# Patient Record
Sex: Female | Born: 1965
Health system: Southern US, Community
[De-identification: ages and names within clinical notes are randomized; demographics above are authoritative.]

## PROBLEM LIST (undated history)

## (undated) DIAGNOSIS — M1990 Unspecified osteoarthritis, unspecified site: Secondary | ICD-10-CM

## (undated) DIAGNOSIS — E119 Type 2 diabetes mellitus without complications: Secondary | ICD-10-CM

## (undated) DIAGNOSIS — R519 Headache, unspecified: Secondary | ICD-10-CM

## (undated) DIAGNOSIS — K219 Gastro-esophageal reflux disease without esophagitis: Secondary | ICD-10-CM

## (undated) DIAGNOSIS — D649 Anemia, unspecified: Secondary | ICD-10-CM

## (undated) DIAGNOSIS — T7840XA Allergy, unspecified, initial encounter: Secondary | ICD-10-CM

## (undated) DIAGNOSIS — K5080 Crohn's disease of both small and large intestine without complications: Secondary | ICD-10-CM

## (undated) DIAGNOSIS — G2581 Restless legs syndrome: Secondary | ICD-10-CM

## (undated) DIAGNOSIS — M461 Sacroiliitis, not elsewhere classified: Secondary | ICD-10-CM

## (undated) DIAGNOSIS — L439 Lichen planus, unspecified: Secondary | ICD-10-CM

## (undated) HISTORY — DX: Type 2 diabetes mellitus without complications: E11.9

## (undated) HISTORY — DX: Sacroiliitis, not elsewhere classified: M46.1

## (undated) HISTORY — DX: Crohn's disease of both small and large intestine without complications: K50.80

## (undated) HISTORY — DX: Lichen planus, unspecified: L43.9

## (undated) HISTORY — DX: Restless legs syndrome: G25.81

## (undated) HISTORY — DX: Allergy, unspecified, initial encounter: T78.40XA

## (undated) HISTORY — DX: Gastro-esophageal reflux disease without esophagitis: K21.9

## (undated) HISTORY — PX: CHOLECYSTECTOMY: SHX55

---

## 2004-04-16 DIAGNOSIS — K5090 Crohn's disease, unspecified, without complications: Secondary | ICD-10-CM

## 2004-04-16 HISTORY — DX: Crohn's disease, unspecified, without complications: K50.90

## 2017-04-11 DIAGNOSIS — K5090 Crohn's disease, unspecified, without complications: Secondary | ICD-10-CM | POA: Insufficient documentation

## 2018-03-28 DIAGNOSIS — Z8719 Personal history of other diseases of the digestive system: Secondary | ICD-10-CM | POA: Insufficient documentation

## 2018-03-28 DIAGNOSIS — E669 Obesity, unspecified: Secondary | ICD-10-CM | POA: Insufficient documentation

## 2018-04-01 DIAGNOSIS — G2581 Restless legs syndrome: Secondary | ICD-10-CM | POA: Insufficient documentation

## 2018-04-16 HISTORY — PX: COLONOSCOPY: SHX174

## 2018-08-01 DIAGNOSIS — Z79899 Other long term (current) drug therapy: Secondary | ICD-10-CM | POA: Insufficient documentation

## 2018-10-07 DIAGNOSIS — M461 Sacroiliitis, not elsewhere classified: Secondary | ICD-10-CM | POA: Insufficient documentation

## 2019-05-06 ENCOUNTER — Encounter (HOSPITAL_COMMUNITY): Payer: Self-pay | Admitting: Emergency Medicine

## 2019-05-06 ENCOUNTER — Emergency Department (HOSPITAL_COMMUNITY): Payer: BC Managed Care – PPO

## 2019-05-06 ENCOUNTER — Emergency Department (HOSPITAL_COMMUNITY)
Admission: EM | Admit: 2019-05-06 | Discharge: 2019-05-06 | Disposition: A | Discharge status: Home / Self Care | Payer: BC Managed Care – PPO | Attending: Emergency Medicine | Admitting: Emergency Medicine

## 2019-05-06 ENCOUNTER — Other Ambulatory Visit: Payer: Self-pay

## 2019-05-06 DIAGNOSIS — R1010 Upper abdominal pain, unspecified: Secondary | ICD-10-CM

## 2019-05-06 DIAGNOSIS — M79661 Pain in right lower leg: Secondary | ICD-10-CM | POA: Diagnosis not present

## 2019-05-06 DIAGNOSIS — K922 Gastrointestinal hemorrhage, unspecified: Secondary | ICD-10-CM

## 2019-05-06 LAB — URINALYSIS, ROUTINE W REFLEX MICROSCOPIC
Bilirubin Urine: NEGATIVE
Glucose, UA: NEGATIVE mg/dL
Hgb urine dipstick: NEGATIVE
Ketones, ur: NEGATIVE mg/dL
Nitrite: NEGATIVE
Protein, ur: 30 mg/dL — AB
Specific Gravity, Urine: 1.019 (ref 1.005–1.030)
pH: 5 (ref 5.0–8.0)

## 2019-05-06 LAB — COMPREHENSIVE METABOLIC PANEL
ALT: 25 U/L (ref 0–44)
AST: 28 U/L (ref 15–41)
Albumin: 4.1 g/dL (ref 3.5–5.0)
Alkaline Phosphatase: 85 U/L (ref 38–126)
Anion gap: 10 (ref 5–15)
BUN: 12 mg/dL (ref 6–20)
CO2: 23 mmol/L (ref 22–32)
Calcium: 9.6 mg/dL (ref 8.9–10.3)
Chloride: 104 mmol/L (ref 98–111)
Creatinine, Ser: 0.82 mg/dL (ref 0.44–1.00)
GFR calc Af Amer: >60 mL/min (ref >60)
GFR calc non Af Amer: >60 mL/min (ref >60)
Glucose, Bld: 92 mg/dL (ref 70–99)
Potassium: 4.1 mmol/L (ref 3.5–5.1)
Sodium: 137 mmol/L (ref 135–145)
Total Bilirubin: 0.5 mg/dL (ref 0.3–1.2)
Total Protein: 8.6 g/dL — ABNORMAL HIGH (ref 6.5–8.1)

## 2019-05-06 LAB — CBC
HCT: 43.4 % (ref 36.0–46.0)
Hemoglobin: 13.7 g/dL (ref 12.0–15.0)
MCH: 28.1 pg (ref 26.0–34.0)
MCHC: 31.6 g/dL (ref 30.0–36.0)
MCV: 89.1 fL (ref 80.0–100.0)
Platelets: 365 10*3/uL (ref 150–400)
RBC: 4.87 MIL/uL (ref 3.87–5.11)
RDW: 14.3 % (ref 11.5–15.5)
WBC: 7.9 10*3/uL (ref 4.0–10.5)
nRBC: 0 % (ref 0.0–0.2)

## 2019-05-06 LAB — I-STAT BETA HCG BLOOD, ED (MC, WL, AP ONLY): I-stat hCG, quantitative: <5 m[IU]/mL (ref <5)

## 2019-05-06 LAB — LIPASE, BLOOD: Lipase: 25 U/L (ref 11–51)

## 2019-05-06 LAB — TROPONIN I (HIGH SENSITIVITY): Troponin I (High Sensitivity): 3 ng/L (ref <18)

## 2019-05-06 MED ORDER — OMEPRAZOLE 20 MG PO CPDR: 20.0000 mg | DELAYED_RELEASE_CAPSULE | Freq: Every day | ORAL | 0 refills | Status: DC

## 2019-05-06 MED ORDER — ONDANSETRON HCL 4 MG/2ML IJ SOLN
4.0000 mg | Freq: Once | INTRAMUSCULAR | Status: AC
  Administered 2019-05-06: 4 mg via INTRAVENOUS
  Filled 2019-05-06: qty 2

## 2019-05-06 MED ORDER — ONDANSETRON 4 MG PO TBDP: 4.0000 mg | ORAL_TABLET | Freq: Three times a day (TID) | ORAL | 0 refills | Status: DC | PRN

## 2019-05-06 MED ORDER — SODIUM CHLORIDE 0.9 % IV BOLUS
500.0000 mL | Freq: Once | INTRAVENOUS | Status: AC
  Administered 2019-05-06: 16:00:00 500 mL via INTRAVENOUS

## 2019-05-06 NOTE — Discharge Instructions (Signed)
Your lab work is reassuring.  I think he likely did vomit up some blood but it appears it was stopped and your lab work is reassuring.  Follow-up with your gastroenterologist.  Take the nausea medicine as needed and take the acid medicine.  Return if you feel more bleeding or lightheadedness or dizziness

## 2019-05-06 NOTE — ED Triage Notes (Addendum)
Pt c/o abd pain whiel at work and was vomiting and had diarrhea (chron's). Then started having chest pain, headache, right calf pain. She reports that her vomit contained blood (bright red) and yellow stuff. All symptoms started less than an hour ago.

## 2019-05-06 NOTE — ED Provider Notes (Signed)
[facility] DEPT Provider Note   CSN: [id number] Arrival date & time: 05/06/19  1431     History Chief Complaint  Patient presents with  . Abdominal Pain  . Headache  . Chest Pain  . Leg Pain    right calf  . Emesis    Sarah Meadows is a 54 y.o. female.  HPI Patient presents with abdominal pain chest pain myalgias vomiting and some diarrhea.  Also some pain in the right calf.  States it began today.  History of Crohn's disease.  Get seen by digestive health for it.  Previously been on Remicade but states it does not work.  States she vomited up some blood today.  Has diarrhea but no blood in the diarrhea today.  States couple days ago the diarrhea was darker.  Chest pain is dull.  No fevers.  No lightheadedness or dizziness.  Pain was in the upper abdomen.    History reviewed. No pertinent past medical history.  There are no problems to display for this patient.   Past Surgical History:  Procedure Laterality Date  . CESAREAN SECTION    . CHOLECYSTECTOMY       OB History   No obstetric history on file.     No family history on file.  Social History   Tobacco Use  . Smoking status: Never Smoker  . Smokeless tobacco: Never Used  Substance Use Topics  . Alcohol use: Not Currently  . Drug use: Not on file    Home Medications Prior to Admission medications   Medication Sig Start Date End Date Taking? Authorizing Provider  omeprazole (PRILOSEC) 20 MG capsule Take 1 capsule (20 mg total) by mouth daily. 05/06/19   Davonna Belling, MD  ondansetron (ZOFRAN-ODT) 4 MG disintegrating tablet Take 1 tablet (4 mg total) by mouth every 8 (eight) hours as needed for nausea or vomiting. 05/06/19   Davonna Belling, MD    Allergies    Patient has no known allergies.  Review of Systems   Review of Systems  Constitutional: Positive for appetite change.  HENT: Negative for congestion.   Respiratory: Negative for shortness of breath.    Cardiovascular: Positive for chest pain.  Gastrointestinal: Positive for abdominal pain, diarrhea, nausea and vomiting.  Musculoskeletal: Negative for back pain.  Skin: Negative for rash.  Neurological: Positive for headaches.  Psychiatric/Behavioral: Negative for confusion.    Physical Exam Updated Vital Signs BP (!) 147/77 (BP Location: Right Arm)   Pulse 90   Temp 98.8 F (37.1 C) (Oral)   Resp 18   SpO2 99%   Physical Exam Vitals and nursing note reviewed.  HENT:     Head: Normocephalic.  Eyes:     Pupils: Pupils are equal, round, and reactive to light.  Cardiovascular:     Rate and Rhythm: Regular rhythm.  Pulmonary:     Breath sounds: No wheezing, rhonchi or rales.  Abdominal:     Palpations: Abdomen is soft.     Tenderness: There is no abdominal tenderness.     Hernia: No hernia is present.  Skin:    General: Skin is warm.     Comments: No extremity tenderness.     ED Results / Procedures / Treatments   Labs (all labs ordered are listed, but only abnormal results are displayed) Labs Reviewed  COMPREHENSIVE METABOLIC PANEL - Abnormal; Notable for the following components:      Result Value   Total Protein 8.6 (*)    All  other components within normal limits  URINALYSIS, ROUTINE W REFLEX MICROSCOPIC - Abnormal; Notable for the following components:   Protein, ur 30 (*)    Leukocytes,Ua SMALL (*)    Bacteria, UA RARE (*)    All other components within normal limits  CBC  LIPASE, BLOOD  I-STAT BETA HCG BLOOD, ED ([facility], [facility], [facility] ONLY)  TROPONIN I (HIGH SENSITIVITY)    EKG EKG Interpretation  Date/Time:  Wednesday May 06 2019 14:42:13 EST Ventricular Rate:  115 PR Interval:  144 QRS Duration: 80 QT Interval:  312 QTC Calculation: 431 R Axis:   -17 Text Interpretation: Sinus tachycardia Otherwise normal ECG No old tracing to compare Confirmed by Aletta Edouard [phone]) on 05/06/2019 2:45:13 PM   Radiology DG Chest 2 View  Result Date: 05/06/2019  CLINICAL DATA:  Chest pain. Abdominal pain with vomiting and diarrhea at work. EXAM: CHEST - 2 VIEW COMPARISON:  None. FINDINGS: The heart size and mediastinal contours are within normal limits. Both lungs are clear. The visualized skeletal structures are unremarkable. IMPRESSION: No active cardiopulmonary disease. Electronically Signed   By: Marin Olp M.D.   On: 05/06/2019 14:58    Procedures Procedures (including critical care time)  Medications Ordered in ED Medications  sodium chloride 0.9 % bolus 500 mL (0 mLs Intravenous Stopped 05/06/19 1639)  ondansetron (ZOFRAN) injection 4 mg (4 mg Intravenous Given 05/06/19 1554)    ED Course  I have reviewed the triage vital signs and the nursing notes.  Pertinent labs & imaging results that were available during my care of the patient were reviewed by me and considered in my medical decision making (see chart for details).    MDM Rules/Calculators/A&P                      Patient presents abdominal pain and vomiting blood.  Benign exam.  Feels better after treatment.  Lab work reassuring.  Heart rate is come down.  No further vomiting.  History of Crohn's disease.  Will start patient on Prilosec and nausea medicine.  She has a gastroenterologist she can follow-up with.  She appears stable for discharge at this time. Final Clinical Impression(s) / ED Diagnoses Final diagnoses:  Pain of upper abdomen  Gastrointestinal hemorrhage, unspecified gastrointestinal hemorrhage type    Rx / DC Orders ED Discharge Orders         Ordered    omeprazole (PRILOSEC) 20 MG capsule  Daily     05/06/19 1804    ondansetron (ZOFRAN-ODT) 4 MG disintegrating tablet  Every 8 hours PRN     05/06/19 1804           Davonna Belling, MD 05/06/19 1806

## 2019-09-02 ENCOUNTER — Telehealth: Payer: Self-pay | Admitting: Gastroenterology

## 2019-09-02 NOTE — Telephone Encounter (Signed)
DOD 09/02/19  Dr. Havery Moros, this patient stated that she has recently moved to [facility] and would like to establish care with a local GI MD.  She has been seeing Dr. Reesa Chew at [facility] for Crohn's disease.  Records are available in [place].  Please review and advise scheduling.

## 2019-09-02 NOTE — Telephone Encounter (Signed)
I can see her in the office for routine consult. thanks

## 2019-09-03 ENCOUNTER — Encounter: Payer: Self-pay | Admitting: Gastroenterology

## 2019-10-21 ENCOUNTER — Telehealth: Payer: Self-pay

## 2019-10-21 NOTE — Telephone Encounter (Signed)
-----   Message from Yetta Flock, MD sent at 10/16/2019  2:04 PM EDT ----- Regarding: RE: new pt that see [place] Jan I'll see her if she wishes to be seen her, I guess you can ask her what her preference is and where she wants to be seen, thanks ----- Message ----- From: Roetta Sessions, CMA Sent: 10/15/2019   5:27 PM EDT To: Yetta Flock, MD Subject: new pt that see [facility]               Dr. Loni Muse - this pt has an appt on Thursday, 7-8 for Crohn's.  However, she has been seeing [facility] for years and as recently as 08-2019.  Would you like to see her? Office policy is that if she has had GI history in the last year she is supposed to provide records for your review to see if you will accept her as a patient first.  I don't know if she wants a 2nd opinion or if she wants to transfer care.  Thanks, Jan

## 2019-10-21 NOTE — Telephone Encounter (Signed)
Called and spoke to pt.  She would like to transfer her care to Dr. Havery Moros.  Faxed a release to pt at [phone]  She will fax to Dr. Bary Leriche office at [facility] asking them to fax her records (colons/EGDs/Pathologies) to [place] for tomorrow's appt. Some records are in [facility]

## 2019-10-22 ENCOUNTER — Other Ambulatory Visit (INDEPENDENT_AMBULATORY_CARE_PROVIDER_SITE_OTHER): Payer: BC Managed Care – PPO

## 2019-10-22 ENCOUNTER — Ambulatory Visit (INDEPENDENT_AMBULATORY_CARE_PROVIDER_SITE_OTHER): Payer: BC Managed Care – PPO | Admitting: Gastroenterology

## 2019-10-22 ENCOUNTER — Encounter: Payer: Self-pay | Admitting: Gastroenterology

## 2019-10-22 VITALS — BP 112/85 | HR 89 | Ht 61.0 in | Wt 204.4 lb

## 2019-10-22 DIAGNOSIS — K50819 Crohn's disease of both small and large intestine with unspecified complications: Secondary | ICD-10-CM | POA: Diagnosis not present

## 2019-10-22 DIAGNOSIS — M461 Sacroiliitis, not elsewhere classified: Secondary | ICD-10-CM

## 2019-10-22 DIAGNOSIS — L439 Lichen planus, unspecified: Secondary | ICD-10-CM

## 2019-10-22 LAB — COMPREHENSIVE METABOLIC PANEL
ALT: 22 U/L (ref 0–35)
AST: 23 U/L (ref 0–37)
Albumin: 4.1 g/dL (ref 3.5–5.2)
Alkaline Phosphatase: 82 U/L (ref 39–117)
BUN: 9 mg/dL (ref 6–23)
CO2: 28 mEq/L (ref 19–32)
Calcium: 9.3 mg/dL (ref 8.4–10.5)
Chloride: 102 mEq/L (ref 96–112)
Creatinine, Ser: 0.71 mg/dL (ref 0.40–1.20)
GFR: 104.17 mL/min (ref >60.00)
Glucose, Bld: 85 mg/dL (ref 70–99)
Potassium: 3.9 mEq/L (ref 3.5–5.1)
Sodium: 137 mEq/L (ref 135–145)
Total Bilirubin: 0.4 mg/dL (ref 0.2–1.2)
Total Protein: 7.9 g/dL (ref 6.0–8.3)

## 2019-10-22 LAB — VITAMIN B12: Vitamin B-12: 364 pg/mL (ref 211–911)

## 2019-10-22 LAB — CBC WITH DIFFERENTIAL/PLATELET
Basophils Absolute: 0 10*3/uL (ref 0.0–0.1)
Basophils Relative: 0.2 % (ref 0.0–3.0)
Eosinophils Absolute: 0.2 10*3/uL (ref 0.0–0.7)
Eosinophils Relative: 3.2 % (ref 0.0–5.0)
HCT: 37.8 % (ref 36.0–46.0)
Hemoglobin: 12.6 g/dL (ref 12.0–15.0)
Lymphocytes Relative: 42.9 % (ref 12.0–46.0)
Lymphs Abs: 2.7 10*3/uL (ref 0.7–4.0)
MCHC: 33.3 g/dL (ref 30.0–36.0)
MCV: 87.2 fl (ref 78.0–100.0)
Monocytes Absolute: 0.5 10*3/uL (ref 0.1–1.0)
Monocytes Relative: 7.2 % (ref 3.0–12.0)
Neutro Abs: 3 10*3/uL (ref 1.4–7.7)
Neutrophils Relative %: 46.5 % (ref 43.0–77.0)
Platelets: 289 10*3/uL (ref 150.0–400.0)
RBC: 4.34 Mil/uL (ref 3.87–5.11)
RDW: 14.3 % (ref 11.5–15.5)
WBC: 6.4 10*3/uL (ref 4.0–10.5)

## 2019-10-22 LAB — C-REACTIVE PROTEIN: CRP: 1.3 mg/dL (ref 0.5–20.0)

## 2019-10-22 LAB — FERRITIN: Ferritin: 125.1 ng/mL (ref 10.0–291.0)

## 2019-10-22 LAB — PROTIME-INR
INR: 1.1 ratio — ABNORMAL HIGH (ref 0.8–1.0)
Prothrombin Time: 12 s (ref 9.6–13.1)

## 2019-10-22 LAB — IBC + FERRITIN
Ferritin: 125.1 ng/mL (ref 10.0–291.0)
Iron: 57 ug/dL (ref 42–145)
Saturation Ratios: 16.6 % — ABNORMAL LOW (ref 20.0–50.0)
Transferrin: 246 mg/dL (ref 212.0–360.0)

## 2019-10-22 LAB — VITAMIN D 25 HYDROXY (VIT D DEFICIENCY, FRACTURES): VITD: 33.27 ng/mL (ref 30.00–100.00)

## 2019-10-22 MED ORDER — BUDESONIDE 3 MG PO CPEP: ORAL_CAPSULE | ORAL | 0 refills | Status: AC

## 2019-10-22 MED ORDER — ONDANSETRON 4 MG PO TBDP: 4.0000 mg | ORAL_TABLET | Freq: Four times a day (QID) | ORAL | 3 refills | Status: DC | PRN

## 2019-10-22 NOTE — Progress Notes (Signed)
HPI : 54 year old female with a reported history of Crohn's disease, sacroiliitis, lichen planus, self-referred here for further evaluation and long-term management of her Crohn's disease.  History obtained through the patient and per review of the chart.  She has been seen most recently by Dr. Esaw Dace for this however she wishes to transfer her care locally to be followed in [place].  From what I can gather she was diagnosed with ileocolonic Crohn's disease in 2006.  She states symptoms were apparently mild at that time and she never had any therapy for this until 2020 or so.  She may have received a few courses of steroids over the years but does not sound like she has had that much at all.  Symptoms apparently worsened in 2020, she had a colonoscopy at that time which revealed multiple deep ulcers in the cecum and a deformed IC valve.  Ileum could not be intubated.  Biopsies showed chronic active colitis.  CT enterography showed some wall thickening in the ileum and questionable duodenum.  At that point time she was placed on Remicade.  During this time she is also developed bilateral sacroiliitis.  Per review of the chart there was some issue with compliance with Remicade infusions.  She apparently felt worse earlier this year in 2021, Remicade levels were drawn and were undetectable with an extremely high antibody level as below and it was stopped.  She was most recently transitioned to Humira and after a few doses developed a rash on her arms for which she was referred to dermatology.  She had a biopsy which showed lichen planus and it was unclear if Humira had been related to that or not.  She has been using steroid ointment for this and it has improved but has not resolved.  She has been off Humira now for several weeks.  She has not been taking anything for her Crohn's disease since then.  Main symptoms from her Crohn's disease are abdominal pain as well as diarrhea.  She has anywhere from  5-6 bowel movements a day, sometimes more.  She does not have any blood in her stools.  She does have some mid to lower abdominal pain which comes and goes, often sporadic, occasionally after she eats.  She does have some nausea postprandially and rare vomiting.  Her weight has been stable, no weight loss.  She denies any NSAIDs.  She has had symptomatic sacroiliitis in the past although it has not really been bothering her that much recently.  She has never had a prior operation for her Crohn's, she does not use any tobacco.  Both her sister and her father have Crohn's disease.  Her last colonoscopy was in 2020.  There was discussion of transitioning to another biologic such as Entyvio or Stelara however she has not pursued that yet and wanted to establish with [place] first.  I do not see any labs for tuberculosis testing, hepatitis, etc. recently.  Labs: 05/14/19 - infliximab level undetectable, antibody level of 1632   EGD 05/25/19 - small HH, erythema of the antrum, normal duodenum - Dr. Esaw Dace - path negative for H pylori, shows mild chronic inactive gastritis  Colonoscopy 05/06/18 - multiple deep ulcers in the cecum, IC valve deformed, could not intubate the TI, diverticulosis noted, otherwise normal colon  - path shows moderate active colitis in IC valve and cecum, otherise normal biopsies throughout  CT enterography - 05/14/18 - wall thickening of the ileum and duodenum, bilateral sacroileitis  CT scan Feb 2021 - mild fatty liver, no active Crohn's otherwise    Past Medical History:  Diagnosis Date  . Crohn's disease of both small and large intestine ([place])   . Lichen planus   . Sacroiliac inflammation [facility])      Past Surgical History:  Procedure Laterality Date  . CESAREAN SECTION    . CHOLECYSTECTOMY     Family History  Problem Relation Age of Onset  . Colon cancer Neg Hx   . Pancreatic cancer Neg Hx   . Stomach cancer Neg Hx    Social History   Tobacco Use  . Smoking  status: Never Smoker  . Smokeless tobacco: Never Used  Vaping Use  . Vaping Use: Never used  Substance Use Topics  . Alcohol use: Not Currently  . Drug use: Not on file   Current Outpatient Medications  Medication Sig Dispense Refill  . clobetasol cream (TEMOVATE) 5.28 % Apply 1 application topically 2 (two) times daily.    Marland Kitchen triamcinolone cream (KENALOG) 0.1 % Apply 1 application topically 2 (two) times daily.    . budesonide (ENTOCORT EC) 3 MG 24 hr capsule Take 3 capsules (9 mg total) by mouth daily for 28 days, THEN 2 capsules (6 mg total) daily for 14 days, THEN 1 capsule (3 mg total) daily for 14 days. 126 capsule 0  . ondansetron (ZOFRAN-ODT) 4 MG disintegrating tablet Take 1 tablet (4 mg total) by mouth every 6 (six) hours as needed for nausea or vomiting. 30 tablet 3   No current facility-administered medications for this visit.   Allergies  Allergen Reactions  . Adalimumab Rash     Review of Systems: All systems reviewed and negative except where noted in HPI.   Most recent labs from January show normal hemoglobin.  LFTs and renal function also normal.  Physical Exam: BP 112/85   Pulse 89   Ht 5' 1"  (1.549 m)   Wt 204 lb 6 oz (92.7 kg)   BMI 38.62 kg/m  Constitutional: Pleasant,well-developed, female in no acute distress. HEENT: Normocephalic and atraumatic. Conjunctivae are normal. No scleral icterus. Neck supple.  Cardiovascular: Normal rate, regular rhythm.  Pulmonary/chest: Effort normal and breath sounds normal. No wheezing, rales or rhonchi. Abdominal: Soft, nondistended, nontender.  There are no masses palpable.  Extremities: no edema Lymphadenopathy: No cervical adenopathy noted. Neurological: Alert and oriented to person place and time. Skin: Skin is warm and dry. No rashes noted. Psychiatric: Normal mood and affect. Behavior is normal.   ASSESSMENT AND PLAN: 54 year old female here for new patient assessment the following:  Ileocolonic Crohn's  disease / sacroiliitis - as above, she carries a diagnosis of Crohn's disease but had been mostly observed for what sounds like 14 years until she had therapy with Remicade when symptoms worsened.  It appears she did have interrupted therapy with Remicade over the course of a year and developed significant antibody titers to it rendering the drug useless with undetectable levels.  As above she was transitioned to Humira which was complicated by development of lichen planus and Humira had been stopped by dermatology.  The patient has ongoing diarrhea and abdominal pain since stopping Humira.  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  I discussed options with the patient.  I am going to send some basic labs to assess for anemia, inflammatory markers, will check fecal calprotectin, we  will check her iron studies and B12 levels, as well as vitamin D.  I discussed her options for Crohn's disease, namely Stelara and Entyvio in regards to biologic therapy, I would recommend 1 of these options.  Given her impressive immunogenicity to Remicade and her course over the past year with development of sacroiliitis, I think she would benefit from combination therapy with the thiopurine as well.  I discussed risks and benefits of biologic therapy and thiopurines as well as combination therapy with her, she was in agreement to proceed with this if she is a candidate.  I will check her TPMT enzyme assay to see if she is cleared for this and check tuberculosis as well as hepatitis testing to clear her for biologic therapy.  Given location of her disease I would prefer to use Stelara if she can get this approved, will check with her insurance company.  In the interim given her ongoing symptoms we will give her a trial of budesonide 9 mg a day for 4 weeks and taper to 6 mg for 2 weeks, then 3 mg for 2 weeks.   Hopefully we can start her on therapy for her Crohn's in the interim.  She will need a colonoscopy for reassessment of her disease at some point time, would prefer to get her set up on biologic therapy and perform this exam after a few months on therapy to assess for mucosal healing, etc.  She was agreeable with this.  I would like to see her back in the next 6 weeks or so, will await her labs with further recommendations.  We will otherwise give some Zofran for her nausea as needed.  She agreed.  If any worsening symptoms or fevers, she needs to contact me immediately.  Of note, recommend  pneumococcal and COVID-19 vaccine prior to starting biologic therapy if she has not already had these.  Lichen planus - she will continue to follow-up with dermatology for this issue  [place] Cellar, MD [facility]

## 2019-10-22 NOTE — Patient Instructions (Addendum)
If you are age 53 or older, your body mass index should be between 23-30. Your Body mass index is 38.62 kg/m. If this is out of the aforementioned range listed, please consider follow up with your Primary Care Provider.  If you are age 83 or younger, your body mass index should be between 19-25. Your Body mass index is 38.62 kg/m. If this is out of the aformentioned range listed, please consider follow up with your Primary Care Provider.   Please go to the lab in the basement of our building to have lab work done as you leave today. Hit "B" for basement when you get on the elevator.  When the doors open the lab is on your left.  We will call you with the results. Thank you.  We have sent the following medications to your pharmacy for you to pick up at your convenience: Budesonide 56m: Take 9 mg a day for 4 weeks, then 6 mg daily for 2 weeks, then 3 mg for 2 weeks  Zofran ODT 4 mg: Take every 6 hours as needed   You have a follow up appointment on Thursday, August 26th at 3:40pm.   Thank you for entrusting me with your care and for [facility]

## 2019-10-23 ENCOUNTER — Other Ambulatory Visit: Payer: BC Managed Care – PPO

## 2019-10-23 DIAGNOSIS — L439 Lichen planus, unspecified: Secondary | ICD-10-CM

## 2019-10-23 DIAGNOSIS — K50819 Crohn's disease of both small and large intestine with unspecified complications: Secondary | ICD-10-CM

## 2019-10-23 DIAGNOSIS — M461 Sacroiliitis, not elsewhere classified: Secondary | ICD-10-CM

## 2019-10-27 LAB — CALPROTECTIN, FECAL: Calprotectin, Fecal: 97 ug/g (ref 0–120)

## 2019-11-01 LAB — QUANTIFERON-TB GOLD PLUS
Mitogen-NIL: >10 IU/mL
NIL: 0.07 IU/mL
QuantiFERON-TB Gold Plus: NEGATIVE
TB1-NIL: 0.02 IU/mL
TB2-NIL: 0.01 IU/mL

## 2019-11-01 LAB — HEPATITIS B CORE ANTIBODY, TOTAL: Hep B Core Total Ab: NONREACTIVE

## 2019-11-01 LAB — HEPATITIS C ANTIBODY
Hepatitis C Ab: NONREACTIVE
SIGNAL TO CUT-OFF: 0.33 (ref <1.00)

## 2019-11-01 LAB — THIOPURINE METHYLTRANSFERASE (TPMT), RBC: Thiopurine Methyltransferase, RBC: 10 nmol/hr/mL RBC — ABNORMAL LOW

## 2019-11-01 LAB — HEPATITIS B SURFACE ANTIGEN: Hepatitis B Surface Ag: NONREACTIVE

## 2019-11-05 ENCOUNTER — Encounter: Payer: Self-pay | Admitting: Gastroenterology

## 2019-11-19 ENCOUNTER — Ambulatory Visit (AMBULATORY_SURGERY_CENTER): Payer: Self-pay

## 2019-11-19 ENCOUNTER — Other Ambulatory Visit: Payer: Self-pay

## 2019-11-19 ENCOUNTER — Encounter: Payer: Self-pay | Admitting: Gastroenterology

## 2019-11-19 VITALS — Ht 61.0 in | Wt 205.6 lb

## 2019-11-19 DIAGNOSIS — K50819 Crohn's disease of both small and large intestine with unspecified complications: Secondary | ICD-10-CM

## 2019-11-19 MED ORDER — NA SULFATE-K SULFATE-MG SULF 17.5-3.13-1.6 GM/177ML PO SOLN: 1.0000 | Freq: Once | ORAL | 0 refills | Status: AC

## 2019-11-19 NOTE — Progress Notes (Signed)
No allergies to soy or egg Pt is not on blood thinners or diet pills-stopped phentermine over a yr ago. Denies issues with sedation/intubation Denies atrial flutter/fib Denies constipation   Emmi instructions given to pt  Pt is aware of Covid safety and care partner requirements.

## 2019-12-02 ENCOUNTER — Encounter: Payer: Self-pay | Admitting: Certified Registered Nurse Anesthetist

## 2019-12-03 ENCOUNTER — Other Ambulatory Visit: Payer: Self-pay

## 2019-12-03 ENCOUNTER — Encounter: Payer: Self-pay | Admitting: Gastroenterology

## 2019-12-03 ENCOUNTER — Ambulatory Visit (AMBULATORY_SURGERY_CENTER): Payer: BC Managed Care – PPO | Admitting: Gastroenterology

## 2019-12-03 VITALS — BP 114/79 | HR 77 | Temp 97.8°F | Resp 17 | Ht 61.0 in | Wt 205.0 lb

## 2019-12-03 DIAGNOSIS — K635 Polyp of colon: Secondary | ICD-10-CM

## 2019-12-03 DIAGNOSIS — K5730 Diverticulosis of large intestine without perforation or abscess without bleeding: Secondary | ICD-10-CM | POA: Diagnosis not present

## 2019-12-03 DIAGNOSIS — K56699 Other intestinal obstruction unspecified as to partial versus complete obstruction: Secondary | ICD-10-CM | POA: Diagnosis not present

## 2019-12-03 DIAGNOSIS — K50819 Crohn's disease of both small and large intestine with unspecified complications: Secondary | ICD-10-CM | POA: Diagnosis not present

## 2019-12-03 DIAGNOSIS — K529 Noninfective gastroenteritis and colitis, unspecified: Secondary | ICD-10-CM | POA: Diagnosis not present

## 2019-12-03 DIAGNOSIS — D125 Benign neoplasm of sigmoid colon: Secondary | ICD-10-CM

## 2019-12-03 DIAGNOSIS — K633 Ulcer of intestine: Secondary | ICD-10-CM

## 2019-12-03 MED ORDER — SODIUM CHLORIDE 0.9 % IV SOLN
500.0000 mL | Freq: Once | INTRAVENOUS | Status: DC
Start: 2019-12-03 — End: 2019-12-03

## 2019-12-03 NOTE — Progress Notes (Signed)
Report given to PACU, vss 

## 2019-12-03 NOTE — Op Note (Signed)
[facility] Patient Name: Sarah Meadows Procedure Date: 12/03/2019 10:21 AM MRN: [id number] Endoscopist: Remo Lipps P. Havery Moros , MD Age: 54 Referring MD:  Date of Birth: Mar 09, 1966 Gender: Female Account #: [id number] Procedure:                Colonoscopy Indications:              For therapy of Crohn's disease of the small bowel                            and colon - failed Remicade (immunogenicity),                            allergic to Humira (skin reaction), now on                            budesonide taper with improvement of symptoms, labs                            without anemia, fecal calprotectin borderline (97),                            here for re-staging of disease prior to                            consideration of another biologic therapy. TPMT 10                            (low) Medicines:                Monitored Anesthesia Care Procedure:                Pre-Anesthesia Assessment:                           - Prior to the procedure, a History and Physical                            was performed, and patient medications and                            allergies were reviewed. The patient's tolerance of                            previous anesthesia was also reviewed. The risks                            and benefits of the procedure and the sedation                            options and risks were discussed with the patient.                            All questions were answered, and informed consent  was obtained. Prior Anticoagulants: The patient has                            taken no previous anticoagulant or antiplatelet                            agents. ASA Grade Assessment: II - A patient with                            mild systemic disease. After reviewing the risks                            and benefits, the patient was deemed in                            satisfactory condition to undergo the procedure.                            After obtaining informed consent, the colonoscope                            was passed under direct vision. Throughout the                            procedure, the patient's blood pressure, pulse, and                            oxygen saturations were monitored continuously. The                            Colonoscope was introduced through the anus and                            advanced to the the terminal ileum, with                            identification of the appendiceal orifice and IC                            valve. The colonoscopy was performed without                            difficulty. The patient tolerated the procedure                            well. The quality of the bowel preparation was                            good. The terminal ileum, ileocecal valve,                            appendiceal orifice, and rectum were photographed. Scope In: 10:29:06 AM Scope Out: 10:51:08 AM Scope Withdrawal Time: 0 hours 18 minutes 56 seconds  Total Procedure  Duration: 0 hours 22 minutes 2 seconds  Findings:                 The perianal and digital rectal examinations were                            normal.                           A mild stenosis was found at the IC valve but                            easily traversed. Patchy inflammation characterized                            by erosions and erythema was found in the terminal                            ileum. There appeared to be another area of                            stricturing a few cm proximal to the valve which                            was not traversed.                           A benign-appearing, intrinsic moderate to severe                            stenosis was found in the cecum near the IC valve.                            This was able to be traversed to visualize the                            cecal cap, which was otherwise normal. Mild                            ulceration noted at the  stricture. Biopsies were                            taken with a cold forceps for histology.                           A 4 mm polyp was found in the sigmoid colon. The                            polyp was sessile. The polyp was removed with a                            cold snare. Resection and retrieval were complete.  Multiple small-mouthed diverticula were found in                            the left colon.                           The exam was otherwise without abnormality. No                            other obvious inflammation noted.                           Biopsies for histology were taken with a cold                            forceps from the right colon, left colon and                            transverse colon for evaluation of colitis. Complications:            No immediate complications. Estimated blood loss:                            Minimal. Estimated Blood Loss:     Estimated blood loss was minimal. Impression:               - Crohn's disease with ileitis - mild stricture at                            IC valve, active ileitis, with stricture of the                            cecum as outlined. Biopsied.                           - One 4 mm polyp in the sigmoid colon, removed with                            a cold snare. Resected and retrieved.                           - Diverticulosis in the left colon.                           - The examination was otherwise normal.                           - Biopsies were taken with a cold forceps from the                            right colon, left colon and transverse colon for                            evaluation of colitis.  Patient warrants escalation of care to biologic                            therapy, recommend Stelara vs. Entyvio, will                            discuss with patient. May also need surgical                            evaluation given ileal and cecal  stricturing. Recommendation:           - Patient has a contact number available for                            emergencies. The signs and symptoms of potential                            delayed complications were discussed with the                            patient. Return to normal activities tomorrow.                            Written discharge instructions were provided to the                            patient.                           - Resume previous diet.                           - Continue present medications.                           - Await pathology results with plans to start                            biologic therapy as outlined. Remo Lipps P. Leodis Alcocer, MD 12/03/2019 10:59:53 AM This report has been signed electronically.

## 2019-12-03 NOTE — Patient Instructions (Signed)
Impression/Recommendations:  Polyp handout given to patient. Diverticulosis handout given to patient.  Resume previous diet. Continue present medications. Await pathology results.  YOU HAD AN ENDOSCOPIC PROCEDURE TODAY AT [facility]:   Refer to the procedure report that was given to you for any specific questions about what was found during the examination.  If the procedure report does not answer your questions, please call your gastroenterologist to clarify.  If you requested that your care partner not be given the details of your procedure findings, then the procedure report has been included in a sealed envelope for you to review at your convenience later.  YOU SHOULD EXPECT: Some feelings of bloating in the abdomen. Passage of more gas than usual.  Walking can help get rid of the air that was put into your GI tract during the procedure and reduce the bloating. If you had a lower endoscopy (such as a colonoscopy or flexible sigmoidoscopy) you may notice spotting of blood in your stool or on the toilet paper. If you underwent a bowel prep for your procedure, you may not have a normal bowel movement for a few days.  Please Note:  You might notice some irritation and congestion in your nose or some drainage.  This is from the oxygen used during your procedure.  There is no need for concern and it should clear up in a day or so.  SYMPTOMS TO REPORT IMMEDIATELY:   Following lower endoscopy (colonoscopy or flexible sigmoidoscopy):  Excessive amounts of blood in the stool  Significant tenderness or worsening of abdominal pains  Swelling of the abdomen that is new, acute  Fever of 100F or higher  For urgent or emergent issues, a gastroenterologist can be reached at any hour by calling [phone]. Do not use MyChart messaging for urgent concerns.    DIET:  We do recommend a small meal at first, but then you may proceed to your regular diet.  Drink plenty of fluids but  you should avoid alcoholic beverages for 24 hours.  ACTIVITY:  You should plan to take it easy for the rest of today and you should NOT DRIVE or use heavy machinery until tomorrow (because of the sedation medicines used during the test).    FOLLOW UP: Our staff will call the number listed on your records 48-72 hours following your procedure to check on you and address any questions or concerns that you may have regarding the information given to you following your procedure. If we do not reach you, we will leave a message.  We will attempt to reach you two times.  During this call, we will ask if you have developed any symptoms of COVID 19. If you develop any symptoms (ie: fever, flu-like symptoms, shortness of breath, cough etc.) before then, please call [phone].  If you test positive for Covid 19 in the 2 weeks post procedure, please call and report this information to [place].    If any biopsies were taken you will be contacted by phone or by letter within the next 1-3 weeks.  Please call us at [phone] if you have not heard about the biopsies in 3 weeks.    SIGNATURES/CONFIDENTIALITY: You and/or your care partner have signed paperwork which will be entered into your electronic medical record.  These signatures attest to the fact that that the information above on your After Visit Summary has been reviewed and is understood.  Full responsibility of the confidentiality of this discharge information lies with  you and/or your care-partner.

## 2019-12-03 NOTE — Progress Notes (Signed)
Called to room to assist during endoscopic procedure.  Patient ID and intended procedure confirmed with present staff. Received instructions for my participation in the procedure from the performing physician.  

## 2019-12-07 ENCOUNTER — Telehealth: Payer: Self-pay

## 2019-12-07 NOTE — Telephone Encounter (Signed)
Covid-19 screening questions   Do you now or have you had a fever in the last 14 days? No.  Do you have any respiratory symptoms of shortness of breath or cough now or in the last 14 days? No.  Do you have any family members or close contacts with diagnosed or suspected Covid-19 in the past 14 days? No.  Have you been tested for Covid-19 and found to be positive? No.      Follow up Call-  Call back number 12/03/2019  Post procedure Call Back phone  # [phone]  Permission to leave phone message Yes     Patient questions:  Do you have a fever, pain , or abdominal swelling? No. Pain Score  0 *  Have you tolerated food without any problems? Yes.    Have you been able to return to your normal activities? Yes.    Do you have any questions about your discharge instructions: Diet   No. Medications  No. Follow up visit  No.  Do you have questions or concerns about your Care? No.  Actions: * If pain score is 4 or above: No action needed, pain <4.

## 2019-12-10 ENCOUNTER — Ambulatory Visit: Payer: BC Managed Care – PPO | Admitting: Gastroenterology

## 2019-12-14 ENCOUNTER — Other Ambulatory Visit: Payer: Self-pay

## 2019-12-14 DIAGNOSIS — K56699 Other intestinal obstruction unspecified as to partial versus complete obstruction: Secondary | ICD-10-CM

## 2019-12-14 DIAGNOSIS — D125 Benign neoplasm of sigmoid colon: Secondary | ICD-10-CM

## 2019-12-14 DIAGNOSIS — K50819 Crohn's disease of both small and large intestine with unspecified complications: Secondary | ICD-10-CM

## 2019-12-28 ENCOUNTER — Telehealth: Payer: Self-pay | Admitting: Gastroenterology

## 2019-12-28 NOTE — Telephone Encounter (Signed)
Sorry to hear this. If she thinks she is having some reflux we can give her protonix 43m / day and see if that helps in regards to her coughing / pain. She was referred to start Stelara for her Crohn's disease. Can you clarify if she is scheduled to start that soon? If she is would like to see how she does with that. If diarrhea persists may do further testing. If you can clarify stelara start date will provide further recs. Thanks

## 2019-12-28 NOTE — Telephone Encounter (Signed)
[place] do we have any openings in clinic with me or APP this week? This can be challenging to sort out without seeing the patient, not sure if her Crohn's or something else is causing her symptoms.

## 2019-12-28 NOTE — Telephone Encounter (Signed)
Spoke with patient, scheduled to see Dr. Havery Moros on 12/29/19 at 2:10 pm.

## 2019-12-28 NOTE — Telephone Encounter (Signed)
Spoke with patient, she reports that after she eats she is coughing a lot causing sharp abdominal pain - started late Saturday evening , nothing OTC, states she has a history of GERD and was prescribed Pantoprazole 40 mg in the am and before bedtime by previous GI doctor. No nausea, vomiting, or constipation.  She does report diarrhea that started last Thursday, over the weekend she states that she had about  5-6 or more episodes of diarrhea a day, no fever. Pt states that she has about 4 days left of Budesonide, currently on 3 mg. Please advise, thank you

## 2019-12-28 NOTE — Telephone Encounter (Signed)
Patient called stating she has a lot of abdominal pain please advise

## 2019-12-28 NOTE — Telephone Encounter (Signed)
Spoke with patient, pt reports that she is already taking Pantoprazole 40 mg in the am and before bedtime. Pt states that she saw rheumatology on 12/23/19 at [facility] for a consultation. Pt states that she did not get scheduled for Stelara infusion yet, they are waiting on insurance approval. Please advise, thanks

## 2019-12-29 ENCOUNTER — Ambulatory Visit: Payer: BC Managed Care – PPO | Admitting: Gastroenterology

## 2019-12-29 ENCOUNTER — Encounter: Payer: Self-pay | Admitting: Gastroenterology

## 2019-12-29 VITALS — BP 112/72 | HR 84 | Ht 61.0 in | Wt 206.0 lb

## 2019-12-29 DIAGNOSIS — R05 Cough: Secondary | ICD-10-CM

## 2019-12-29 DIAGNOSIS — K219 Gastro-esophageal reflux disease without esophagitis: Secondary | ICD-10-CM

## 2019-12-29 DIAGNOSIS — R059 Cough, unspecified: Secondary | ICD-10-CM

## 2019-12-29 DIAGNOSIS — K50818 Crohn's disease of both small and large intestine with other complication: Secondary | ICD-10-CM

## 2019-12-29 MED ORDER — SUCRALFATE 1 GM/10ML PO SUSP: 1.0000 g | Freq: Three times a day (TID) | ORAL | 1 refills | Status: DC | PRN

## 2019-12-29 MED ORDER — BUDESONIDE 3 MG PO CPEP: ORAL_CAPSULE | ORAL | 0 refills | Status: AC

## 2019-12-29 MED ORDER — DEXLANSOPRAZOLE 60 MG PO CPDR: 60.0000 mg | DELAYED_RELEASE_CAPSULE | Freq: Every day | ORAL | 0 refills | Status: DC

## 2019-12-29 NOTE — Patient Instructions (Addendum)
If you are age 53 or older, your body mass index should be between 23-30. Your Body mass index is 38.92 kg/m. If this is out of the aforementioned range listed, please consider follow up with your Primary Care Provider.  If you are age 30 or younger, your body mass index should be between 19-25. Your Body mass index is 38.92 kg/m. If this is out of the aformentioned range listed, please consider follow up with your Primary Care Provider.   Continue with Stelara.  We have sent the following medications to your pharmacy for you to pick up at your convenience: Budesonide 3 mg: take 3 tablets by mouth (9 mg) daily for 14 days, then take 2 tablets (6 mg) daily for 14 days, then take 1 tablet (3 mg)daily for 14 days.  Carafate suspension: Take 10 ml every 8 hours as needed  Please stop Protonix.  We have given you samples of the following medication to take: Dexilant 60 PO:IPPG once daily  Please follow up in 3 months.  You will be contacted by [facility] to help you establish with a Primary Care Provider.  Thank you for entrusting me with your care and for choosing [facility], Dr. Hamberg Cellar

## 2019-12-29 NOTE — Progress Notes (Signed)
HPI :  54 year old female here for follow-up of ileocolonic Crohn's disease, sacroiliitis.  See prior intake note for full detailed history of her Crohn's.  Crohn's disease Dx with ileocolonic Crohn's disease in 2006.  She states symptoms were apparently mild at that time and she never had any therapy for this until 2020 or so. Symptoms worsened in 2020, she had a colonoscopy at that time which revealed multiple deep ulcers in the cecum and a deformed IC valve.  Ileum could not be intubated.  Biopsies showed chronic active colitis.  CT enterography showed some wall thickening in the ileum and questionable duodenum.  At that point time she was placed on Remicade.  During this time she is also developed bilateral sacroiliitis.  Per review of the chart there was some issue with compliance with Remicade infusions.  She apparently felt worse earlier this year in 2021, Remicade levels were drawn and were undetectable with an extremely high antibody level as below and it was stopped.  She was most recently transitioned to Humira and after a few doses developed a rash on her arms for which she was referred to dermatology.  She had a biopsy which showed lichen planus and it was unclear if Humira had been related to that or not.  She has been using steroid ointment for this and it has improved but has not resolved.  She then stopped Humira and was referred to [place]  Since last visit  Patient here for a follow-up visit for her Crohn's disease.  We performed routine labs for her after our last visit, she had no evidence of anemia or vitamin deficiency.  Her fecal calprotectin was only mildly elevated to the 90s.  Prior to escalation of therapy I had recommended a colonoscopy which was performed in August.  She had a severe stricture with deformity of her cecum.  There was mild inflammatory changes at the stricture.  She also had a stenosis at the IC valve, with active inflammation in the ileum and another stricture  proximal to that which was not traversed.  Cecal stricture showed no precancerous changes, only inflammation.  She has since been approved for Stelara.  We are waiting for that to be infused in the next few weeks hopefully.  She is tested negative for QuantiFERON gold and hepatitis.  Since I have seen her I gave her a budesonide taper at 9 mg a day for a few weeks and then she slowly taper down.  She is currently on 3 mg a day and do to stop shortly.  She states the higher dose budesonide helped her, as she is tapered down she is had some recurrence of symptoms.  She has had some crampy abdominal pain and diarrhea starting last Thursday, she still having some loose stools.  She has some nausea but no vomiting.  She has some lower abdominal discomfort.  She states the symptoms are typical for her Crohn's disease.  She otherwise states she has been having reflux symptoms with pyrosis that has been bothering her lately.  This is despite taking Protonix 40 mg twice daily.  She is also had daily cough for the over a month now.  She thinks her cough is related to poorly controlled reflux although she has not had any further evaluation for this.  She currently does not have a primary care physician.  She is up-to-date with her Covid vaccine.  She is due for a flu shot.  She is due for pneumococcal vaccine.  Prior workup: Labs: 05/14/19 - infliximab level undetectable, antibody level of 1632   EGD 05/25/19 - small HH, erythema of the antrum, normal duodenum - Dr. Esaw Dace - path negative for H pylori, shows mild chronic inactive gastritis  Colonoscopy 05/06/18 - multiple deep ulcers in the cecum, IC valve deformed, could not intubate the TI, diverticulosis noted, otherwise normal colon  - path shows moderate active colitis in IC valve and cecum, otherise normal biopsies throughout  CT enterography - 05/14/18 - wall thickening of the ileum and duodenum, bilateral sacroileitis  CT scan Feb 2021 - mild  fatty liver, no active Crohn's otherwise   TPMT 10 TB negative 10/22/19 Hep testing negative B12 / iron studies okay inflamatory markers normal,   Fecal calprotectin 97 - intermediate  Colonoscopy 12/03/19 - The perianal and digital rectal examinations were normal. - A mild stenosis was found at the IC valve but easily traversed. Patchy inflammation characterized by erosions and erythema was found in the terminal ileum. There appeared to be another area of stricturing a few cm proximal to the valve which was not traversed. - A benign-appearing, intrinsic moderate to severe stenosis was found in the cecum near the IC valve. This was able to be traversed to visualize the cecal cap, which was otherwise normal. Mild ulceration noted at the stricture. Biopsies were taken with a cold forceps for histology. - A 4 mm polyp was found in the sigmoid colon. The polyp was sessile. The polyp was removed with a cold snare. Resection and retrieval were complete. - Multiple small-mouthed diverticula were found in the left colon. - The exam was otherwise without abnormality. No other obvious inflammation noted.   1. Surgical [P], colon, cecal stricture - CHRONIC MILDLY ACTIVE COLITIS. - NO DYSPLASIA OR MALIGNANCY. 2. Surgical [P], random colon bx - BENIGN COLONIC MUCOSA. - NO ACTIVE INFLAMMATION. - NO DYSPLASIA OR MALIGNANCY. 3. Surgical [P], colon, sigmoid, polyp - HYPERPLASTIC POLYP. - NO DYSPLASIA OR MALIGNANCY.     Past Medical History:  Diagnosis Date  . Crohn's disease of both small and large intestine ([place])   . Lichen planus   . Sacroiliac inflammation [facility])      Past Surgical History:  Procedure Laterality Date  . CESAREAN SECTION    . CHOLECYSTECTOMY    . COLONOSCOPY  04/2018   Family History  Problem Relation Age of Onset  . Colon cancer Neg Hx   . Pancreatic cancer Neg Hx   . Stomach cancer Neg Hx   . Colon polyps Neg Hx   . Esophageal cancer Neg Hx   . Rectal  cancer Neg Hx    Social History   Tobacco Use  . Smoking status: Never Smoker  . Smokeless tobacco: Never Used  Vaping Use  . Vaping Use: Never used  Substance Use Topics  . Alcohol use: Not Currently  . Drug use: Never   Current Outpatient Medications  Medication Sig Dispense Refill  . clobetasol cream (TEMOVATE) 2.87 % Apply 1 application topically 2 (two) times daily.     . ondansetron (ZOFRAN-ODT) 4 MG disintegrating tablet Take 1 tablet (4 mg total) by mouth every 6 (six) hours as needed for nausea or vomiting. 30 tablet 3  . triamcinolone cream (KENALOG) 0.1 % Apply 1 application topically 2 (two) times daily.     . budesonide (ENTOCORT EC) 3 MG 24 hr capsule Take 3 capsules (9 mg total) by mouth daily for 14 days, THEN 2 capsules (6 mg total) daily for  14 days, THEN 1 capsule (3 mg total) daily for 14 days. 84 capsule 0  . dexlansoprazole (DEXILANT) 60 MG capsule Take 1 capsule (60 mg total) by mouth daily. 20 capsule 0  . sucralfate (CARAFATE) 1 GM/10ML suspension Take 10 mLs (1 g total) by mouth every 8 (eight) hours as needed. 420 mL 1   No current facility-administered medications for this visit.   Allergies  Allergen Reactions  . Adalimumab Rash     Review of Systems: All systems reviewed and negative except where noted in HPI.   Lab Results  Component Value Date   WBC 6.4 10/22/2019   HGB 12.6 10/22/2019   HCT 37.8 10/22/2019   MCV 87.2 10/22/2019   PLT 289.0 10/22/2019    Lab Results  Component Value Date   CREATININE 0.71 10/22/2019   BUN 9 10/22/2019   NA 137 10/22/2019   K 3.9 10/22/2019   CL 102 10/22/2019   CO2 28 10/22/2019   Lab Results  Component Value Date   ALT 22 10/22/2019   AST 23 10/22/2019   ALKPHOS 82 10/22/2019   BILITOT 0.4 10/22/2019     Physical Exam: BP 112/72   Pulse 84   Ht 5' 1"  (1.549 m)   Wt 206 lb (93.4 kg)   BMI 38.92 kg/m  Constitutional: Pleasant,well-developed, female in no acute distress. HEENT:  Normocephalic and atraumatic. Conjunctivae are normal. No scleral icterus. Neck supple.  Cardiovascular: Normal rate, regular rhythm.  Pulmonary/chest: Effort normal and breath sounds normal. No wheezing, rales or rhonchi. Abdominal: Soft, nondistended, some mild mid abdominal TTP. Marland Kitchen There are no masses palpable.  Extremities: no edema Lymphadenopathy: No cervical adenopathy noted. Neurological: Alert and oriented to person place and time. Skin: Skin is warm and dry. No rashes noted. Psychiatric: Normal mood and affect. Behavior is normal.   ASSESSMENT AND PLAN: 54 year old female here for reassessment the following:  Crohn's disease - history as outlined above, longstanding Crohn's disease but only treated in recent years, had immunogenicity to Remicade and had a skin reaction to Humira.  Has not done well with anti-TNF.  Recent colonoscopy shows stricturing of the cecum with active inflammation of the ileum.  I have discussed options with her and recommend biologic therapy.  Specifically I think Delsa Grana is a good option for her.  I discussed risks and benefits of this and she wants to proceed with it.  She is cleared to start it and is due to hopefully to start with her infusion soon.  Unfortunately as she is tapered off budesonide she has had recurrent Crohn's symptoms.  I will increase her budesonide back to 9 mg a day for 2 weeks, then taper to 6 mg for 2 weeks (assuming she is on Stelara by then), and then taper to 3 mg a day for 2 weeks and then off.  Her TPMT is intermediate.  We discussed whether or not to add a thiopurine.  Recent data suggests that the benefit of adding thiopurine to Stelara is not significant, and monotherapy is just as beneficial.  Immunogenicity also less with Stelara.  She wishes to continue with monotherapy of Stelara and we will await her course.  She is due for flu shot, pneumococcal vaccine, and plans to get those however we will address her cough first as outlined  below.  I will see her back in a few months for reassessment of her Crohn's.  GERD / Cough - sounds like poorly controlled reflux despite Protonix 40 mg twice  daily.  Discussed options with her.  She has had an EGD earlier this year without any significant pathology.  No Barrett's.  We will give her a free sample of Dexilant 60 mg a day for 2 weeks to see if that helps.  We will also use some liquid Carafate as well.  We will see if that improves her cough, if it related to reflux hopefully that gets better.  However if her pyrosis improved but her cough is no better, she needs further evaluation for other causes of chronic cough.  She does not have a primary care currently, I strongly recommended that she establish with a primary care in her area and she will work on doing this.  Her lungs are clear today, no wheezing.  Geneva Cellar, MD [facility]

## 2019-12-30 ENCOUNTER — Other Ambulatory Visit: Payer: Self-pay

## 2019-12-30 MED ORDER — SUCRALFATE 1 G PO TABS: 1.0000 g | ORAL_TABLET | Freq: Three times a day (TID) | ORAL | 2 refills | Status: DC | PRN

## 2019-12-30 NOTE — Progress Notes (Signed)
Carafate suspension not covered by pt's insurance.  Replace with tablets. Instructions given to make a slurry before ingestion

## 2020-01-12 ENCOUNTER — Encounter: Payer: Self-pay | Admitting: Family Medicine

## 2020-01-12 ENCOUNTER — Other Ambulatory Visit: Payer: Self-pay

## 2020-01-12 ENCOUNTER — Ambulatory Visit (INDEPENDENT_AMBULATORY_CARE_PROVIDER_SITE_OTHER): Payer: BC Managed Care – PPO | Admitting: Family Medicine

## 2020-01-12 ENCOUNTER — Telehealth: Payer: Self-pay

## 2020-01-12 VITALS — BP 120/70 | HR 98 | Temp 97.7°F | Resp 17 | Ht 61.0 in | Wt 202.3 lb

## 2020-01-12 DIAGNOSIS — R059 Cough, unspecified: Secondary | ICD-10-CM

## 2020-01-12 DIAGNOSIS — K50818 Crohn's disease of both small and large intestine with other complication: Secondary | ICD-10-CM

## 2020-01-12 DIAGNOSIS — Z09 Encounter for follow-up examination after completed treatment for conditions other than malignant neoplasm: Secondary | ICD-10-CM

## 2020-01-12 DIAGNOSIS — M461 Sacroiliitis, not elsewhere classified: Secondary | ICD-10-CM | POA: Diagnosis not present

## 2020-01-12 DIAGNOSIS — Z0000 Encounter for general adult medical examination without abnormal findings: Secondary | ICD-10-CM

## 2020-01-12 DIAGNOSIS — G2581 Restless legs syndrome: Secondary | ICD-10-CM | POA: Diagnosis not present

## 2020-01-12 DIAGNOSIS — Z6838 Body mass index (BMI) 38.0-38.9, adult: Secondary | ICD-10-CM

## 2020-01-12 DIAGNOSIS — R05 Cough: Secondary | ICD-10-CM | POA: Diagnosis not present

## 2020-01-12 DIAGNOSIS — R0602 Shortness of breath: Secondary | ICD-10-CM

## 2020-01-12 DIAGNOSIS — E6609 Other obesity due to excess calories: Secondary | ICD-10-CM

## 2020-01-12 DIAGNOSIS — K50919 Crohn's disease, unspecified, with unspecified complications: Secondary | ICD-10-CM

## 2020-01-12 LAB — POCT GLYCOSYLATED HEMOGLOBIN (HGB A1C)
HbA1c POC (<> result, manual entry): 10.1 % (ref 4.0–5.6)
HbA1c, POC (controlled diabetic range): 10.1 % — AB (ref 0.0–7.0)
HbA1c, POC (prediabetic range): 10.1 % — AB (ref 5.7–6.4)
Hemoglobin A1C: 10.1 % — AB (ref 4.0–5.6)

## 2020-01-12 LAB — POCT URINALYSIS DIPSTICK
Bilirubin, UA: NEGATIVE
Blood, UA: NEGATIVE
Glucose, UA: NEGATIVE
Ketones, UA: NEGATIVE
Leukocytes, UA: NEGATIVE
Nitrite, UA: NEGATIVE
Protein, UA: POSITIVE — AB
Spec Grav, UA: >=1.03 — AB (ref 1.010–1.025)
Urobilinogen, UA: 0.2 E.U./dL
pH, UA: 5.5 (ref 5.0–8.0)

## 2020-01-12 LAB — GLUCOSE, POCT (MANUAL RESULT ENTRY): POC Glucose: 344 mg/dl — AB (ref 70–99)

## 2020-01-12 MED ORDER — ALBUTEROL SULFATE HFA 108 (90 BASE) MCG/ACT IN AERS: 2.0000 | INHALATION_SPRAY | Freq: Four times a day (QID) | RESPIRATORY_TRACT | 11 refills | Status: DC | PRN

## 2020-01-12 MED ORDER — STELARA 90 MG/ML ~~LOC~~ SOSY: PREFILLED_SYRINGE | SUBCUTANEOUS | 3 refills | Status: DC

## 2020-01-12 NOTE — Progress Notes (Addendum)
Patient [facility]   New Patient--Establish Care  Subjective:  Patient ID: Sarah Meadows, female    DOB: Nov 26, 1965  Age: 54 y.o. MRN: [id number]  CC:  Chief Complaint  Patient presents with  . Follow-up    Pt states she is having a lot of coughingX2wks. Pt states no fever. Pt also states she is having leg cramps and cramps in R arm. X3days. Pt states she started a new medcation ad she doesn't know if that is the reason. Pt also states she here some whezzing.    HPI Sarah Meadows is a 54 year old female who presents to Establish Care today.    Patient Active Problem List   Diagnosis Date Noted  . Bilateral sacroiliitis ([place]) 10/07/2018  . Controlled substance agreement signed 08/01/2018  . Restless leg 04/01/2018  . History of diverticulitis 03/28/2018  . Obesity (BMI 30.0-34.9) 03/28/2018  . Crohn's disease ([place]) 04/11/2017   Current Status: This will be Sarah Meadows's initial office visit with me. She was previously a patient at [facility]. she has relocated to [facility] from [place], [place] in 2018. Since then she has seen Dr. Alford Highland at [facility] in [place], [place]. Her last office visit, she is doing well with no complaints. She has c/o cough X 2 weeks and wheezing. No chest pain, heart palpitations, and shortness of breath reported. She also reports increased leg cramps X 3 weeks now. She reports a history of Chron's Disease. She reports 5-6 episodes of diarrhea daily. No reports of GI problems such as vomiting, and constipation. She has no reports of blood in stools, dysuria and hematuria. She is currently seeing Dr. Roosevelt Cellar with [facility]. She denies fevers, chills, fatigue, recent infections, weight loss, and night sweats. She has not had any headaches, visual changes, dizziness, and falls.   No depression or anxiety reported today. She is taking all medications as prescribed.   Past Medical  History:  Diagnosis Date  . Crohn's disease ([place]) 2006  . Crohn's disease of both small and large intestine ([place])   . Lichen planus   . Restless leg syndrome   . Sacroiliac inflammation [facility])     Past Surgical History:  Procedure Laterality Date  . CESAREAN SECTION    . CHOLECYSTECTOMY    . COLONOSCOPY  04/2018    Family History  Problem Relation Age of Onset  . Colon cancer Neg Hx   . Pancreatic cancer Neg Hx   . Stomach cancer Neg Hx   . Colon polyps Neg Hx   . Esophageal cancer Neg Hx   . Rectal cancer Neg Hx     Social History   Socioeconomic History  . Marital status: Single    Spouse name: Not on file  . Number of children: Not on file  . Years of education: Not on file  . Highest education level: Not on file  Occupational History  . Not on file  Tobacco Use  . Smoking status: Never Smoker  . Smokeless tobacco: Never Used  Vaping Use  . Vaping Use: Never used  Substance and Sexual Activity  . Alcohol use: Not Currently  . Drug use: Never  . Sexual activity: Not on file  Other Topics Concern  . Not on file  Social History Narrative  . Not on file   Social Determinants of Health   Financial Resource Strain:   . Difficulty of Paying Living Expenses: Not on  file  Food Insecurity:   . Worried About Charity fundraiser in the Last Year: Not on file  . Ran Out of Food in the Last Year: Not on file  Transportation Needs:   . Lack of Transportation (Medical): Not on file  . Lack of Transportation (Non-Medical): Not on file  Physical Activity:   . Days of Exercise per Week: Not on file  . Minutes of Exercise per Session: Not on file  Stress:   . Feeling of Stress : Not on file  Social Connections:   . Frequency of Communication with Friends and Family: Not on file  . Frequency of Social Gatherings with Friends and Family: Not on file  . Attends Religious Services: Not on file  . Active Member of Clubs or Organizations: Not on file  . Attends Theatre manager Meetings: Not on file  . Marital Status: Not on file  Intimate Partner Violence:   . Fear of Current or Ex-Partner: Not on file  . Emotionally Abused: Not on file  . Physically Abused: Not on file  . Sexually Abused: Not on file    Outpatient Medications Prior to Visit  Medication Sig Dispense Refill  . budesonide (ENTOCORT EC) 3 MG 24 hr capsule Take 3 capsules (9 mg total) by mouth daily for 14 days, THEN 2 capsules (6 mg total) daily for 14 days, THEN 1 capsule (3 mg total) daily for 14 days. 84 capsule 0  . clobetasol cream (TEMOVATE) 2.50 % Apply 1 application topically 2 (two) times daily.     Marland Kitchen dexlansoprazole (DEXILANT) 60 MG capsule Take 1 capsule (60 mg total) by mouth daily. 20 capsule 0  . ondansetron (ZOFRAN-ODT) 4 MG disintegrating tablet Take 1 tablet (4 mg total) by mouth every 6 (six) hours as needed for nausea or vomiting. 30 tablet 3  . triamcinolone cream (KENALOG) 0.1 % Apply 1 application topically 2 (two) times daily.     . sucralfate (CARAFATE) 1 g tablet Take 1 tablet (1 g total) by mouth every 8 (eight) hours as needed. Slowly dissolve tablet in 1 Tablespoon of distilled water prior to ingesting (Patient not taking: Reported on 01/12/2020) 60 tablet 2   No facility-administered medications prior to visit.    Allergies  Allergen Reactions  . Adalimumab Rash    ROS Review of Systems  Constitutional: Negative.   HENT: Negative.   Eyes: Negative.   Respiratory: Negative.   Cardiovascular: Negative.   Gastrointestinal: Positive for abdominal distention (obese).  Endocrine: Negative.   Genitourinary: Negative.   Musculoskeletal: Positive for arthralgias (generalized joint pain ).  Skin: Negative.   Allergic/Immunologic: Negative.   Neurological: Positive for dizziness (occasional) and headaches (occasional ).  Hematological: Negative.   Psychiatric/Behavioral: Negative.     Objective:    Physical Exam Vitals and nursing note reviewed.    Constitutional:      Appearance: Normal appearance.  HENT:     Head: Normocephalic and atraumatic.     Nose: Nose normal.     Mouth/Throat:     Mouth: Mucous membranes are moist.     Pharynx: Oropharynx is clear.  Cardiovascular:     Rate and Rhythm: Normal rate and regular rhythm.     Pulses: Normal pulses.     Heart sounds: Normal heart sounds.  Pulmonary:     Effort: Pulmonary effort is normal.     Breath sounds: Normal breath sounds.  Abdominal:     General: Bowel sounds are normal. There  is distension (obese).     Palpations: Abdomen is soft.  Musculoskeletal:        General: Normal range of motion.     Cervical back: Normal range of motion and neck supple.  Skin:    General: Skin is warm and dry.  Neurological:     General: No focal deficit present.     Mental Status: She is alert and oriented to person, place, and time.  Psychiatric:        Mood and Affect: Mood normal.        Behavior: Behavior normal.        Thought Content: Thought content normal.        Judgment: Judgment normal.     BP 120/70 (BP Location: Right Arm, Patient Position: Sitting, Cuff Size: Large)   Pulse 98   Temp 97.7 F (36.5 C)   Resp 17   Ht 5' 1"  (1.549 m)   Wt 202 lb 4.8 oz (91.8 kg)   SpO2 100%   BMI 38.22 kg/m  Wt Readings from Last 3 Encounters:  01/12/20 202 lb 4.8 oz (91.8 kg)  12/29/19 206 lb (93.4 kg)  12/03/19 205 lb (93 kg)     Health Maintenance Due  Topic Date Due  . COVID-19 Vaccine (1) Never done  . HIV Screening  Never done  . PAP SMEAR-Modifier  Never done  . MAMMOGRAM  Never done  . INFLUENZA VACCINE  11/15/2019    There are no preventive care reminders to display for this patient.  No results found for: TSH Lab Results  Component Value Date   WBC 6.4 10/22/2019   HGB 12.6 10/22/2019   HCT 37.8 10/22/2019   MCV 87.2 10/22/2019   PLT 289.0 10/22/2019   Lab Results  Component Value Date   NA 137 10/22/2019   K 3.9 10/22/2019   CO2 28 10/22/2019    GLUCOSE 85 10/22/2019   BUN 9 10/22/2019   CREATININE 0.71 10/22/2019   BILITOT 0.4 10/22/2019   ALKPHOS 82 10/22/2019   AST 23 10/22/2019   ALT 22 10/22/2019   PROT 7.9 10/22/2019   ALBUMIN 4.1 10/22/2019   CALCIUM 9.3 10/22/2019   ANIONGAP 10 05/06/2019   GFR 104.17 10/22/2019   No results found for: CHOL No results found for: HDL No results found for: LDLCALC No results found for: TRIG No results found for: [facility] Lab Results  Component Value Date   HGBA1C 10.1 (A) 01/12/2020   HGBA1C 10.1 01/12/2020   HGBA1C 10.1 (A) 01/12/2020   HGBA1C 10.1 (A) 01/12/2020     Assessment & Plan:   1. Establish care   2. Crohn's disease with complication, unspecified gastrointestinal tract location ([place]) Stable. She will continue to follow up with GI as needed.  - TSH - Lipid Panel  3. Cough Stable today.  - Urinalysis Dipstick  4. Shortness of breath Stable today. No signs or symptoms of respiratory distress noted today.  - albuterol (VENTOLIN HFA) 108 (90 Base) MCG/ACT inhaler; Inhale 2 puffs into the lungs every 6 (six) hours as needed for wheezing or shortness of breath.  Dispense: 8 g; Refill: 11  5. Restless leg  6. Class 2 obesity due to excess calories with body mass index (BMI) of 38.0 to 38.9 in adult, unspecified whether serious comorbidity present Body mass index is 38.22 kg/m. Goal BMI  is <30. Encouraged efforts to reduce weight include engaging in physical activity as tolerated with goal of 150 minutes per week. Improve  dietary choices and eat a meal regimen consistent with a Mediterranean or DASH diet. Reduce simple carbohydrates. Do not skip meals and eat healthy snacks throughout the day to avoid over-eating at dinner. Set a goal weight loss that is achievable for you.  7. Healthcare maintenance - POC Glucose (CBG) - POC HgB A1c  8. Follow up She will follow up in 1 month.   Meds ordered this encounter  Medications  . albuterol (VENTOLIN HFA) 108 (90  Base) MCG/ACT inhaler    Sig: Inhale 2 puffs into the lungs every 6 (six) hours as needed for wheezing or shortness of breath.    Dispense:  8 g    Refill:  11    Orders Placed This Encounter  Procedures  . TSH  . Lipid Panel  . Urinalysis Dipstick  . POC Glucose (CBG)  . POC HgB A1c    Referral Orders  No referral(s) requested today    Kathe Becton,  MSN, FNP-BC [address] [place], [place] [address] [phone]- fax    Problem List Items Addressed This Visit      Digestive   Crohn's disease [facility])   Relevant Orders   TSH   Lipid Panel     Musculoskeletal and Integument   Bilateral sacroiliitis ([place]) - Primary     Other   Restless leg    Other Visit Diagnoses    Shortness of breath       Relevant Medications   albuterol (VENTOLIN HFA) 108 (90 Base) MCG/ACT inhaler   Class 2 obesity due to excess calories with body mass index (BMI) of 38.0 to 38.9 in adult, unspecified whether serious comorbidity present       Healthcare maintenance       Relevant Orders   POC Glucose (CBG) (Completed)   POC HgB A1c (Completed)   Follow up          Meds ordered this encounter  Medications  . albuterol (VENTOLIN HFA) 108 (90 Base) MCG/ACT inhaler    Sig: Inhale 2 puffs into the lungs every 6 (six) hours as needed for wheezing or shortness of breath.    Dispense:  8 g    Refill:  11    Follow-up: No follow-ups on file.    Azzie Glatter, FNP

## 2020-01-12 NOTE — Telephone Encounter (Signed)
Received call from Br[place]ly at [facility], she states that patient had her Stelara infusion on 01/07/2020. Patient would like to proceed with maintenance dose as self injection. Will send RX for Stelara 90 mg sub q every 8 weeks to [facility].   Spoke with patient, offered to enroll her in the nurse ambassador program with Stelara or asked if she would like to keep her injection training at [facility] on 03/03/20. Stephanie at [facility] is aware that patient will keep appt as scheduled. Pt states that she would like to keep her appt that is scheduled, pt is aware that she will need to take the medication with her to the training and that they will give her a reminder call. Pt verbalized understanding, advised to give me a call if she had any questions.

## 2020-01-13 LAB — TSH: TSH: 1.74 u[IU]/mL (ref 0.450–4.500)

## 2020-01-13 LAB — LIPID PANEL
Chol/HDL Ratio: 5.3 ratio — ABNORMAL HIGH (ref 0.0–4.4)
Cholesterol, Total: 246 mg/dL — ABNORMAL HIGH (ref 100–199)
HDL: 46 mg/dL (ref >39)
LDL Chol Calc (NIH): 158 mg/dL — ABNORMAL HIGH (ref 0–99)
Triglycerides: 228 mg/dL — ABNORMAL HIGH (ref 0–149)
VLDL Cholesterol Cal: 42 mg/dL — ABNORMAL HIGH (ref 5–40)

## 2020-01-15 DIAGNOSIS — E785 Hyperlipidemia, unspecified: Secondary | ICD-10-CM

## 2020-01-15 HISTORY — DX: Hyperlipidemia, unspecified: E78.5

## 2020-01-21 ENCOUNTER — Encounter: Payer: Self-pay | Admitting: Family Medicine

## 2020-01-21 ENCOUNTER — Other Ambulatory Visit: Payer: Self-pay | Admitting: Family Medicine

## 2020-01-21 DIAGNOSIS — E786 Lipoprotein deficiency: Secondary | ICD-10-CM

## 2020-01-21 MED ORDER — ATORVASTATIN CALCIUM 10 MG PO TABS: 10.0000 mg | ORAL_TABLET | Freq: Every day | ORAL | 3 refills | Status: DC

## 2020-01-21 NOTE — Telephone Encounter (Signed)
-----   Message from Azzie Glatter, FNP sent at 01/21/2020  3:54 PM EDT ----- Cholesterol levels are mildly elevated. New Rx for Atorvastatin sent to pharmacy today. Continue low-fat, low cholesterol diet, increase fluids, increase fruits and vegetables. She will continue to decrease high sodium intake, excessive alcohol intake, increase potassium intake, smoking cessation, and increase physical activity of at least 30 minutes of cardio activity daily. She will continue to follow Heart Healthy or DASH diet.  All other lab is stable. Keep follow up appointment.

## 2020-01-24 ENCOUNTER — Other Ambulatory Visit: Payer: Self-pay | Admitting: Gastroenterology

## 2020-01-26 ENCOUNTER — Other Ambulatory Visit (INDEPENDENT_AMBULATORY_CARE_PROVIDER_SITE_OTHER): Payer: BC Managed Care – PPO

## 2020-01-26 ENCOUNTER — Other Ambulatory Visit: Payer: Self-pay

## 2020-01-26 DIAGNOSIS — K219 Gastro-esophageal reflux disease without esophagitis: Secondary | ICD-10-CM

## 2020-01-26 DIAGNOSIS — Z0000 Encounter for general adult medical examination without abnormal findings: Secondary | ICD-10-CM

## 2020-01-26 LAB — GLUCOSE, POCT (MANUAL RESULT ENTRY): POC Glucose: 244 mg/dl — AB (ref 70–99)

## 2020-01-27 LAB — H. PYLORI BREATH TEST: H pylori Breath Test: NEGATIVE

## 2020-01-28 ENCOUNTER — Other Ambulatory Visit: Payer: Self-pay | Admitting: Family Medicine

## 2020-01-28 DIAGNOSIS — Z09 Encounter for follow-up examination after completed treatment for conditions other than malignant neoplasm: Secondary | ICD-10-CM

## 2020-01-28 DIAGNOSIS — K219 Gastro-esophageal reflux disease without esophagitis: Secondary | ICD-10-CM

## 2020-01-28 MED ORDER — FAMOTIDINE 20 MG PO TABS: 20.0000 mg | ORAL_TABLET | Freq: Two times a day (BID) | ORAL | 6 refills | Status: DC | PRN

## 2020-02-01 ENCOUNTER — Telehealth: Payer: Self-pay

## 2020-02-01 NOTE — Telephone Encounter (Signed)
Received call from [facility] regarding a PA for Stelara prescription.   I was provided with a phone number to call to complete the PA via telephone. [phone]

## 2020-02-02 ENCOUNTER — Other Ambulatory Visit: Payer: Self-pay

## 2020-02-02 DIAGNOSIS — K50818 Crohn's disease of both small and large intestine with other complication: Secondary | ICD-10-CM

## 2020-02-02 MED ORDER — STELARA 90 MG/ML ~~LOC~~ SOSY: PREFILLED_SYRINGE | SUBCUTANEOUS | 3 refills | Status: DC

## 2020-02-02 NOTE — Progress Notes (Signed)
Pt was called to discuss her lab results. Pt stated she understood and will keep her f/u appt. Pt was advised if she need anything before to give [place] a call.

## 2020-02-02 NOTE — Telephone Encounter (Signed)
[facility] sent a fax stating that Stelara had been approved but will have to be sent through [facility].  Received prescription clarification request from [facility]. Request is for a new prescription since this was transferred from [facility].   New prescription sent to [facility].

## 2020-02-12 ENCOUNTER — Encounter: Payer: Self-pay | Admitting: Family Medicine

## 2020-02-17 ENCOUNTER — Ambulatory Visit (INDEPENDENT_AMBULATORY_CARE_PROVIDER_SITE_OTHER): Payer: BC Managed Care – PPO | Admitting: Family Medicine

## 2020-02-17 ENCOUNTER — Other Ambulatory Visit: Payer: Self-pay

## 2020-02-17 ENCOUNTER — Encounter: Payer: Self-pay | Admitting: Family Medicine

## 2020-02-17 VITALS — BP 114/65 | HR 85 | Temp 97.5°F | Resp 16 | Ht 61.0 in | Wt 205.6 lb

## 2020-02-17 DIAGNOSIS — E119 Type 2 diabetes mellitus without complications: Secondary | ICD-10-CM | POA: Diagnosis not present

## 2020-02-17 DIAGNOSIS — Z23 Encounter for immunization: Secondary | ICD-10-CM | POA: Diagnosis not present

## 2020-02-17 DIAGNOSIS — Z09 Encounter for follow-up examination after completed treatment for conditions other than malignant neoplasm: Secondary | ICD-10-CM

## 2020-02-17 DIAGNOSIS — E1165 Type 2 diabetes mellitus with hyperglycemia: Secondary | ICD-10-CM | POA: Diagnosis not present

## 2020-02-17 DIAGNOSIS — R059 Cough, unspecified: Secondary | ICD-10-CM

## 2020-02-17 DIAGNOSIS — Z0000 Encounter for general adult medical examination without abnormal findings: Secondary | ICD-10-CM | POA: Diagnosis not present

## 2020-02-17 DIAGNOSIS — K50919 Crohn's disease, unspecified, with unspecified complications: Secondary | ICD-10-CM

## 2020-02-17 DIAGNOSIS — K219 Gastro-esophageal reflux disease without esophagitis: Secondary | ICD-10-CM

## 2020-02-17 MED ORDER — BLOOD GLUCOSE METER KIT: PACK | 0 refills | Status: DC

## 2020-02-17 MED ORDER — METFORMIN HCL 1000 MG PO TABS: 1000.0000 mg | ORAL_TABLET | Freq: Two times a day (BID) | ORAL | 3 refills | Status: DC

## 2020-02-17 MED ORDER — FAMOTIDINE 20 MG PO TABS: 20.0000 mg | ORAL_TABLET | Freq: Two times a day (BID) | ORAL | 6 refills | Status: DC | PRN

## 2020-02-17 MED ORDER — GLIPIZIDE 10 MG PO TABS: 10.0000 mg | ORAL_TABLET | Freq: Two times a day (BID) | ORAL | 3 refills | Status: DC

## 2020-02-17 MED ORDER — INSULIN ASPART 100 UNIT/ML ~~LOC~~ SOLN: 10.0000 [IU] | Freq: Three times a day (TID) | SUBCUTANEOUS | 99 refills | Status: DC

## 2020-02-17 MED ORDER — INSULIN GLARGINE 100 UNITS/ML SOLOSTAR PEN: 20.0000 [IU] | PEN_INJECTOR | Freq: Every day | SUBCUTANEOUS | 1 refills | Status: DC

## 2020-02-17 MED ORDER — AMOXICILLIN-POT CLAVULANATE 875-125 MG PO TABS: 1.0000 | ORAL_TABLET | Freq: Two times a day (BID) | ORAL | 0 refills | Status: AC

## 2020-02-17 NOTE — Patient Instructions (Addendum)
Benzonatate capsules What is this medicine? BENZONATATE (ben ZOE na tate) is used to treat cough. This medicine may be used for other purposes; ask your health care provider or pharmacist if you have questions. COMMON BRAND NAME(S): Tessalon Perles, Zonatuss What should I tell my health care provider before I take this medicine? They need to know if you have any of these conditions:  kidney or liver disease  an unusual or allergic reaction to benzonatate, anesthetics, other medicines, foods, dyes, or preservatives  pregnant or trying to get pregnant  breast-feeding How should I use this medicine? Take this medicine by mouth with a glass of water. Follow the directions on the prescription label. Avoid breaking, chewing, or sucking the capsule, as this can cause serious side effects. Take your medicine at regular intervals. Do not take your medicine more often than directed. Talk to your pediatrician regarding the use of this medicine in children. While this drug may be prescribed for children as young as 14 years old for selected conditions, precautions do apply. Overdosage: If you think you have taken too much of this medicine contact a poison control center or emergency room at once. NOTE: This medicine is only for you. Do not share this medicine with others. What if I miss a dose? If you miss a dose, take it as soon as you can. If it is almost time for your next dose, take only that dose. Do not take double or extra doses. What may interact with this medicine? Do not take this medicine with any of the following medications:  MAOIs like Carbex, Eldepryl, Marplan, Nardil, and Parnate This list may not describe all possible interactions. Give your health care provider a list of all the medicines, herbs, non-prescription drugs, or dietary supplements you use. Also tell them if you smoke, drink alcohol, or use illegal drugs. Some items may interact with your medicine. What should I watch for  while using this medicine? Tell your doctor if your symptoms do not improve or if they get worse. If you have a high fever, skin rash, or headache, see your health care professional. You may get drowsy or dizzy. Do not drive, use machinery, or do anything that needs mental alertness until you know how this medicine affects you. Do not sit or stand up quickly, especially if you are an older patient. This reduces the risk of dizzy or fainting spells. What side effects may I notice from receiving this medicine? Side effects that you should report to your doctor or health care professional as soon as possible:  allergic reactions like skin rash, itching or hives, swelling of the face, lips, or tongue  breathing problems  chest pain  confusion or hallucinations  irregular heartbeat  numbness of mouth or throat  seizures Side effects that usually do not require medical attention (report to your doctor or health care professional if they continue or are bothersome):  burning feeling in the eyes  constipation  headache  nasal congestion  stomach upset This list may not describe all possible side effects. Call your doctor for medical advice about side effects. You may report side effects to FDA at 1-800-FDA-1088. Where should I keep my medicine? Keep out of the reach of children. Store at room temperature between 15 and 30 degrees C (59 and 86 degrees F). Keep tightly closed. Protect from light and moisture. Throw away any unused medicine after the expiration date. NOTE: This sheet is a summary. It may not cover all possible information.  If you have questions about this medicine, talk to your doctor, pharmacist, or health care provider.  2020 Elsevier/Gold Standard (2007-07-02 14:52:56) Amoxicillin; Clavulanic Acid Tablets What is this medicine? AMOXICILLIN; CLAVULANIC ACID (a mox i SIL in; KLAV yoo lan ic AS id) is a penicillin antibiotic. It treats some infections caused by bacteria.  It will not work for colds, the flu, or other viruses. This medicine may be used for other purposes; ask your health care provider or pharmacist if you have questions. COMMON BRAND NAME(S): Augmentin What should I tell my health care provider before I take this medicine? They need to know if you have any of these conditions:  bowel disease, like colitis  kidney disease  liver disease  mononucleosis  an unusual or allergic reaction to amoxicillin, penicillin, cephalosporin, other antibiotics, clavulanic acid, other medicines, foods, dyes, or preservatives  pregnant or trying to get pregnant  breast-feeding How should I use this medicine? Take this drug by mouth. Take it as directed on the prescription label at the same time every day. Take it with food at the start of a meal or snack. Take all of this drug unless your health care provider tells you to stop it early. Keep taking it even if you think you are better. Talk to your health care provider about the use of this drug in children. While it may be prescribed for selected conditions, precautions do apply. Overdosage: If you think you have taken too much of this medicine contact a poison control center or emergency room at once. NOTE: This medicine is only for you. Do not share this medicine with others. What if I miss a dose? If you miss a dose, take it as soon as you can. If it is almost time for your next dose, take only that dose. Do not take double or extra doses. What may interact with this medicine?  allopurinol  anticoagulants  birth control pills  methotrexate  probenecid This list may not describe all possible interactions. Give your health care provider a list of all the medicines, herbs, non-prescription drugs, or dietary supplements you use. Also tell them if you smoke, drink alcohol, or use illegal drugs. Some items may interact with your medicine. What should I watch for while using this medicine? Tell your  doctor or healthcare provider if your symptoms do not improve. This medicine may cause serious skin reactions. They can happen weeks to months after starting the medicine. Contact your healthcare provider right away if you notice fevers or flu-like symptoms with a rash. The rash may be red or purple and then turn into blisters or peeling of the skin. Or, you might notice a red rash with swelling of the face, lips or lymph nodes in your neck or under your arms. Do not treat diarrhea with over the counter products. Contact your doctor if you have diarrhea that lasts more than 2 days or if it is severe and watery. If you have diabetes, you may get a false-positive result for sugar in your urine. Check with your doctor or healthcare provider. Birth control pills may not work properly while you are taking this medicine. Talk to your doctor about using an extra method of birth control. What side effects may I notice from receiving this medicine? Side effects that you should report to your doctor or health care professional as soon as possible:  allergic reactions like skin rash, itching or hives, swelling of the face, lips, or tongue  breathing problems  dark urine  fever or chills, sore throat  redness, blistering, peeling, or loosening of the skin, including inside the mouth  seizures  trouble passing urine or change in the amount of urine  unusual bleeding, bruising  unusually weak or tired  white patches or sores in the mouth or throat Side effects that usually do not require medical attention (report to your doctor or health care professional if they continue or are bothersome):  diarrhea  dizziness  headache  nausea, vomiting  stomach upset  vaginal or anal irritation This list may not describe all possible side effects. Call your doctor for medical advice about side effects. You may report side effects to FDA at 1-800-FDA-1088. Where should I keep my medicine? Keep out of  the reach of children and pets. Store at room temperature between 20 and 25 degrees C (68 and 77 degrees F). Throw away any unused drug after the expiration date. NOTE: This sheet is a summary. It may not cover all possible information. If you have questions about this medicine, talk to your doctor, pharmacist, or health care provider.  2020 Elsevier/Gold Standard (2018-11-03 11:55:53) Glipizide tablets What is this medicine? GLIPIZIDE (GLIP i zide) helps to treat type 2 diabetes. Treatment is combined with diet and exercise. The medicine helps your body to use insulin better. This medicine may be used for other purposes; ask your health care provider or pharmacist if you have questions. COMMON BRAND NAME(S): Glucotrol What should I tell my health care provider before I take this medicine? They need to know if you have any of these conditions:  diabetic ketoacidosis  glucose-6-phosphate dehydrogenase deficiency  heart disease  kidney disease  liver disease  porphyria  severe infection or injury  thyroid disease  an unusual or allergic reaction to glipizide, sulfa drugs, other medicines, foods, dyes, or preservatives  pregnant or trying to get pregnant  breast-feeding How should I use this medicine? Take this medicine by mouth. Swallow with a drink of water. Do not take with food. Take it 30 minutes before a meal. Follow the directions on the prescription label. If you take this medicine once a day, take it 30 minutes before breakfast. Take your doses at the same time each day. Do not take more often than directed. Talk to your pediatrician regarding the use of this medicine in children. Special care may be needed. Elderly patients over 26 years old may have a stronger reaction and need a smaller dose. Overdosage: If you think you have taken too much of this medicine contact a poison control center or emergency room at once. NOTE: This medicine is only for you. Do not share this  medicine with others. What if I miss a dose? If you miss a dose, take it as soon as you can. If it is almost time for your next dose, take only that dose. Do not take double or extra doses. What may interact with this medicine?  bosentan  chloramphenicol  cisapride  clarithromycin  medicines for fungal or yeast infections  metoclopramide  probenecid  warfarin Many medications may cause an increase or decrease in blood sugar, these include:  alcohol containing beverages  aspirin and aspirin-like drugs  chloramphenicol  chromium  diuretics  female hormones, like estrogens or progestins and birth control pills  heart medicines  isoniazid  female hormones or anabolic steroids  medicines for weight loss  medicines for allergies, asthma, cold, or cough  medicines for mental problems  medicines called MAO Inhibitors like  Nardil, Parnate, Marplan, Eldepryl  niacin  NSAIDs, medicines for pain and inflammation, like ibuprofen or naproxen  pentamidine  phenytoin  probenecid  quinolone antibiotics like ciprofloxacin, levofloxacin, ofloxacin  some herbal dietary supplements  steroid medicines like prednisone or cortisone  thyroid medicine This list may not describe all possible interactions. Give your health care provider a list of all the medicines, herbs, non-prescription drugs, or dietary supplements you use. Also tell them if you smoke, drink alcohol, or use illegal drugs. Some items may interact with your medicine. What should I watch for while using this medicine? Visit your doctor or health care professional for regular checks on your progress. A test called the HbA1C (A1C) will be monitored. This is a simple blood test. It measures your blood sugar control over the last 2 to 3 months. You will receive this test every 3 to 6 months. Learn how to check your blood sugar. Learn the symptoms of low and high blood sugar and how to manage them. Always carry a  quick-source of sugar with you in case you have symptoms of low blood sugar. Examples include hard sugar candy or glucose tablets. Make sure others know that you can choke if you eat or drink when you develop serious symptoms of low blood sugar, such as seizures or unconsciousness. They must get medical help at once. Tell your doctor or health care professional if you have high blood sugar. You might need to change the dose of your medicine. If you are sick or exercising more than usual, you might need to change the dose of your medicine. Do not skip meals. Ask your doctor or health care professional if you should avoid alcohol. Many nonprescription cough and cold products contain sugar or alcohol. These can affect blood sugar. This medicine can make you more sensitive to the sun. Keep out of the sun. If you cannot avoid being in the sun, wear protective clothing and use sunscreen. Do not use sun lamps or tanning beds/booths. Wear a medical ID bracelet or chain, and carry a card that describes your disease and details of your medicine and dosage times. What side effects may I notice from receiving this medicine? Side effects that you should report to your doctor or health care professional as soon as possible:  allergic reactions like skin rash, itching or hives, swelling of the face, lips, or tongue  breathing problems  dark urine  fever, chills, sore throat  signs and symptoms of low blood sugar such as feeling anxious, confusion, dizziness, increased hunger, unusually weak or tired, sweating, shakiness, cold, irritable, headache, blurred vision, fast heartbeat, loss of consciousness  unusual bleeding or bruising  yellowing of the eyes or skin Side effects that usually do not require medical attention (report to your doctor or health care professional if they continue or are bothersome):  diarrhea  dizziness  headache  heartburn  nausea  stomach gas This list may not describe all  possible side effects. Call your doctor for medical advice about side effects. You may report side effects to FDA at 1-800-FDA-1088. Where should I keep my medicine? Keep out of the reach of children. Store at room temperature below 30 degrees C (86 degrees F). Throw away any unused medicine after the expiration date. NOTE: This sheet is a summary. It may not cover all possible information. If you have questions about this medicine, talk to your doctor, pharmacist, or health care provider.  2020 Elsevier/Gold Standard (2012-07-16 14:42:46) Metformin tablets What is this  medicine? METFORMIN (met FOR min) is used to treat type 2 diabetes. It helps to control blood sugar. Treatment is combined with diet and exercise. This medicine can be used alone or with other medicines for diabetes. This medicine may be used for other purposes; ask your health care provider or pharmacist if you have questions. COMMON BRAND NAME(S): Glucophage What should I tell my health care provider before I take this medicine? They need to know if you have any of these conditions:  anemia  dehydration  heart disease  frequently drink alcohol-containing beverages  kidney disease  liver disease  polycystic ovary syndrome  serious infection or injury  vomiting  an unusual or allergic reaction to metformin, other medicines, foods, dyes, or preservatives  pregnant or trying to get pregnant  breast-feeding How should I use this medicine? Take this medicine by mouth with a glass of water. Follow the directions on the prescription label. Take this medicine with food. Take your medicine at regular intervals. Do not take your medicine more often than directed. Do not stop taking except on your doctor's advice. Talk to your pediatrician regarding the use of this medicine in children. While this drug may be prescribed for children as young as 43 years of age for selected conditions, precautions do apply. Overdosage:  If you think you have taken too much of this medicine contact a poison control center or emergency room at once. NOTE: This medicine is only for you. Do not share this medicine with others. What if I miss a dose? If you miss a dose, take it as soon as you can. If it is almost time for your next dose, take only that dose. Do not take double or extra doses. What may interact with this medicine? Do not take this medicine with any of the following medications:  certain contrast medicines given before X-rays, CT scans, MRI, or other procedures  dofetilide This medicine may also interact with the following medications:  acetazolamide  alcohol  certain antivirals for HIV or hepatitis  certain medicines for blood pressure, heart disease, irregular heart beat  cimetidine  dichlorphenamide  digoxin  diuretics  female hormones, like estrogens or progestins and birth control pills  glycopyrrolate  isoniazid  lamotrigine  memantine  methazolamide  metoclopramide  midodrine  niacin  phenothiazines like chlorpromazine, mesoridazine, prochlorperazine, thioridazine  phenytoin  ranolazine  steroid medicines like prednisone or cortisone  stimulant medicines for attention disorders, weight loss, or to stay awake  thyroid medicines  topiramate  trospium  vandetanib  zonisamide This list may not describe all possible interactions. Give your health care provider a list of all the medicines, herbs, non-prescription drugs, or dietary supplements you use. Also tell them if you smoke, drink alcohol, or use illegal drugs. Some items may interact with your medicine. What should I watch for while using this medicine? Visit your doctor or health care professional for regular checks on your progress. A test called the HbA1C (A1C) will be monitored. This is a simple blood test. It measures your blood sugar control over the last 2 to 3 months. You will receive this test every 3 to 6  months. Learn how to check your blood sugar. Learn the symptoms of low and high blood sugar and how to manage them. Always carry a quick-source of sugar with you in case you have symptoms of low blood sugar. Examples include hard sugar candy or glucose tablets. Make sure others know that you can choke if you eat or  drink when you develop serious symptoms of low blood sugar, such as seizures or unconsciousness. They must get medical help at once. Tell your doctor or health care professional if you have high blood sugar. You might need to change the dose of your medicine. If you are sick or exercising more than usual, you might need to change the dose of your medicine. Do not skip meals. Ask your doctor or health care professional if you should avoid alcohol. Many nonprescription cough and cold products contain sugar or alcohol. These can affect blood sugar. This medicine may cause ovulation in premenopausal women who do not have regular monthly periods. This may increase your chances of becoming pregnant. You should not take this medicine if you become pregnant or think you may be pregnant. Talk with your doctor or health care professional about your birth control options while taking this medicine. Contact your doctor or health care professional right away if you think you are pregnant. If you are going to need surgery, a MRI, CT scan, or other procedure, tell your doctor that you are taking this medicine. You may need to stop taking this medicine before the procedure. Wear a medical ID bracelet or chain, and carry a card that describes your disease and details of your medicine and dosage times. This medicine may cause a decrease in folic acid and vitamin B12. You should make sure that you get enough vitamins while you are taking this medicine. Discuss the foods you eat and the vitamins you take with your health care professional. What side effects may I notice from receiving this medicine? Side effects  that you should report to your doctor or health care professional as soon as possible:  allergic reactions like skin rash, itching or hives, swelling of the face, lips, or tongue  breathing problems  feeling faint or lightheaded, falls  muscle aches or pains  signs and symptoms of low blood sugar such as feeling anxious, confusion, dizziness, increased hunger, unusually weak or tired, sweating, shakiness, cold, irritable, headache, blurred vision, fast heartbeat, loss of consciousness  slow or irregular heartbeat  unusual stomach pain or discomfort  unusually tired or weak Side effects that usually do not require medical attention (report to your doctor or health care professional if they continue or are bothersome):  diarrhea  headache  heartburn  metallic taste in mouth  nausea  stomach gas, upset This list may not describe all possible side effects. Call your doctor for medical advice about side effects. You may report side effects to FDA at 1-800-FDA-1088. Where should I keep my medicine? Keep out of the reach of children. Store at room temperature between 15 and 30 degrees C (59 and 86 degrees F). Protect from moisture and light. Throw away any unused medicine after the expiration date. NOTE: This sheet is a summary. It may not cover all possible information. If you have questions about this medicine, talk to your doctor, pharmacist, or health care provider.  2020 Elsevier/Gold Standard (2017-05-09 19:15:19) Insulin Glargine injection What is this medicine? INSULIN GLARGINE (IN su lin GLAR geen) is a human-made form of insulin. This drug lowers the amount of sugar in your blood. It is a long-acting insulin that is usually given once a day. This medicine may be used for other purposes; ask your health care provider or pharmacist if you have questions. COMMON BRAND NAME(S): BASAGLAR, Lantus, Lantus SoloStar, Semglee, Toujeo Max SoloStar, [employer] What should I tell  my health care provider before I  take this medicine? They need to know if you have any of these conditions:  episodes of low blood sugar  eye disease, vision problems  kidney disease  liver disease  an unusual or allergic reaction to insulin, metacresol, other medicines, foods, dyes, or preservatives  pregnant or trying to get pregnant  breast-feeding How should I use this medicine? This medicine is for injection under the skin. Use this medicine at the same time each day. Use exactly as directed. This insulin should never be mixed in the same syringe with other insulins before injection. Do not vigorously shake before use. You will be taught how to use this medicine and how to adjust doses for activities and illness. Do not use more insulin than prescribed. Always check the appearance of your insulin before using it. This medicine should be clear and colorless like water. Do not use it if it is cloudy, thickened, colored, or has solid particles in it. If you use an insulin pen, be sure to take off the outer needle cover before using the dose. It is important that you put your used needles and syringes in a special sharps container. Do not put them in a trash can. If you do not have a sharps container, call your pharmacist or healthcare provider to get one. This drug comes with INSTRUCTIONS FOR USE. Ask your pharmacist for directions on how to use this drug. Read the information carefully. Talk to your pharmacist or health care provider if you have questions. Talk to your pediatrician regarding the use of this medicine in children. While this drug may be prescribed for children as young as 6 years for selected conditions, precautions do apply. Overdosage: If you think you have taken too much of this medicine contact a poison control center or emergency room at once. NOTE: This medicine is only for you. Do not share this medicine with others. What if I miss a dose? It is important not to  miss a dose. Your health care professional or doctor should discuss a plan for missed doses with you. If you do miss a dose, follow their plan. Do not take double doses. What may interact with this medicine?  other medicines for diabetes Many medications may cause changes in blood sugar, these include:  alcohol containing beverages  antiviral medicines for HIV or AIDS  aspirin and aspirin-like drugs  certain medicines for blood pressure, heart disease, irregular heart beat  chromium  diuretics  female hormones, such as estrogens or progestins, birth control pills  fenofibrate  gemfibrozil  isoniazid  lanreotide  female hormones or anabolic steroids  MAOIs like Carbex, Eldepryl, Marplan, Nardil, and Parnate  medicines for weight loss  medicines for allergies, asthma, cold, or cough  medicines for depression, anxiety, or psychotic disturbances  niacin  nicotine  NSAIDs, medicines for pain and inflammation, like ibuprofen or naproxen  octreotide  pasireotide  pentamidine  phenytoin  probenecid  quinolone antibiotics such as ciprofloxacin, levofloxacin, ofloxacin  some herbal dietary supplements  steroid medicines such as prednisone or cortisone  sulfamethoxazole; trimethoprim  thyroid hormones Some medications can hide the warning symptoms of low blood sugar (hypoglycemia). You may need to monitor your blood sugar more closely if you are taking one of these medications. These include:  beta-blockers, often used for high blood pressure or heart problems (examples include atenolol, metoprolol, propranolol)  clonidine  guanethidine  reserpine This list may not describe all possible interactions. Give your health care provider a list of all the medicines,  herbs, non-prescription drugs, or dietary supplements you use. Also tell them if you smoke, drink alcohol, or use illegal drugs. Some items may interact with your medicine. What should I watch for  while using this medicine? Visit your health care professional or doctor for regular checks on your progress. Do not drive, use machinery, or do anything that needs mental alertness until you know how this medicine affects you. Alcohol may interfere with the effect of this medicine. Avoid alcoholic drinks. A test called the HbA1C (A1C) will be monitored. This is a simple blood test. It measures your blood sugar control over the last 2 to 3 months. You will receive this test every 3 to 6 months. Learn how to check your blood sugar. Learn the symptoms of low and high blood sugar and how to manage them. Always carry a quick-source of sugar with you in case you have symptoms of low blood sugar. Examples include hard sugar candy or glucose tablets. Make sure others know that you can choke if you eat or drink when you develop serious symptoms of low blood sugar, such as seizures or unconsciousness. They must get medical help at once. Tell your doctor or health care professional if you have high blood sugar. You might need to change the dose of your medicine. If you are sick or exercising more than usual, you might need to change the dose of your medicine. Do not skip meals. Ask your doctor or health care professional if you should avoid alcohol. Many nonprescription cough and cold products contain sugar or alcohol. These can affect blood sugar. Make sure that you have the right kind of syringe for the type of insulin you use. Try not to change the brand and type of insulin or syringe unless your health care professional or doctor tells you to. Switching insulin brand or type can cause dangerously high or low blood sugar. Always keep an extra supply of insulin, syringes, and needles on hand. Use a syringe one time only. Throw away syringe and needle in a closed container to prevent accidental needle sticks. Insulin pens and cartridges should never be shared. Even if the needle is changed, sharing may result in  passing of viruses like hepatitis or HIV. Each time you get a new box of pen needles, check to see if they are the same type as the ones you were trained to use. If not, ask your health care professional to show you how to use this new type properly. Wear a medical ID bracelet or chain, and carry a card that describes your disease and details of your medicine and dosage times. What side effects may I notice from receiving this medicine? Side effects that you should report to your doctor or health care professional as soon as possible:  allergic reactions like skin rash, itching or hives, swelling of the face, lips, or tongue  breathing problems  signs and symptoms of high blood sugar such as dizziness, dry mouth, dry skin, fruity breath, nausea, stomach pain, increased hunger or thirst, increased urination  signs and symptoms of low blood sugar such as feeling anxious, confusion, dizziness, increased hunger, unusually weak or tired, sweating, shakiness, cold, irritable, headache, blurred vision, fast heartbeat, loss of consciousness Side effects that usually do not require medical attention (report to your doctor or health care professional if they continue or are bothersome):  increase or decrease in fatty tissue under the skin due to overuse of a particular injection site  itching, burning, swelling, or  rash at site where injected This list may not describe all possible side effects. Call your doctor for medical advice about side effects. You may report side effects to FDA at 1-800-FDA-1088. Where should I keep my medicine? Keep out of the reach of children. Unopened Vials: Lantus vials: Store in a refrigerator between 2 and 8 degrees C (36 and 46 degrees F) or at room temperature below 30 degrees C (86 degrees F). Do not freeze or use if the insulin has been frozen. Protect from light and excessive heat. If stored at room temperature, the vial must be discarded after 28 days. Throw away any  unopened and unused medicine that has been stored in the refrigerator after the expiration date. Unopened Pens: Neurosurgeon: Store in a refrigerator between 2 and 8 degrees C (36 and 46 degrees F) or at room temperature below 30 degrees C (86 degrees F). Do not freeze or use if the insulin has been frozen. Protect from light and excessive heat. If stored at room temperature, the pen must be discarded after 28 days. Throw away any unopened and unused medicine that has been stored in the refrigerator after the expiration date. Lantus Solostar Pens: Store in a refrigerator between 2 and 8 degrees C (36 and 46 degrees F) or at room temperature below 30 degrees C (86 degrees F). Do not freeze or use if the insulin has been frozen. Protect from light and excessive heat. If stored at room temperature, the pen must be discarded after 28 days. Throw away any unopened and unused medicine that has been stored in the refrigerator after the expiration date. Semglee Pens: Store in a refrigerator between 2 and 8 degrees C (36 and 46 degrees F) or at room temperature below 30 degrees C (86 degrees F). Do not freeze or use if the insulin has been frozen. Protect from light and excessive heat. If stored at room temperature, the pen must be discarded after 28 days. Throw away any unopened and unused medicine that has been stored in the refrigerator after the expiration date. Toujeo Solostar Pens or Toujeo [employer] Pens: Store in a refrigerator between 2 and 8 degrees C (36 and 46 degrees F). Do not freeze or use if the insulin has been frozen. Protect from light and excessive heat. Throw away any unopened and unused medicine that has been stored in the refrigerator after the expiration date. Vials that you are using: Lantus vials: Store in a refrigerator or at room temperature below 30 degrees C (86 degrees F). Do not freeze. Keep away from heat and light. Throw the opened vial away after 28 days. Semglee vials:  Store in a refrigerator or at room temperature below 30 degrees C (86 degrees F). Do not freeze. Keep away from heat and light. Throw the opened vial away after 28 days. Pens that you are using: Basaglar KwikPens: Store at room temperature below 30 degrees C (86 degrees F). Do not refrigerate or freeze. Keep away from heat and light. Throw the pen away after 28 days, even if it still has insulin left in it. Lantus Solostar Pens: Store at room temperature below 30 degrees C (86 degrees F). Do not refrigerate or freeze. Keep away from heat and light. Throw the pen away after 28 days, even if it still has insulin left in it. Semglee Pens: Store at room temperature below 30 degrees C (86 degrees F). Do not refrigerate or freeze. Keep away from heat and light. Throw the pen  away after 28 days, even if it still has insulin left in it. Toujeo Solostar Pens or Toujeo [employer] Pens: Store at room temperature below 30 degrees C (86 degrees F). Do not refrigerate or freeze. Keep away from heat and light. Throw the pen away after 56 days, even if it still has insulin left in it. NOTE: This sheet is a summary. It may not cover all possible information. If you have questions about this medicine, talk to your doctor, pharmacist, or health care provider.  2020 Elsevier/Gold Standard (2019-01-21 15:00:23) Insulin Aspart injection What is this medicine? INSULIN ASPART (IN su lin AS part) is a human-made form of insulin. This drug lowers the amount of sugar in your blood. It is a fast acting insulin that starts working faster than regular insulin. It will not work as long as regular insulin. This medicine may be used for other purposes; ask your health care provider or pharmacist if you have questions. COMMON BRAND NAME(S): Fiasp, [employer], Medtronic, NovoLog, NovoLog Flexpen, NovoLog PenFill What should I tell my health care provider before I take this medicine? They need to know if you have any of  these conditions:  episodes of low blood sugar  eye disease, vision problems  kidney disease  liver disease  an unusual or allergic reaction to insulin, metacresol, other medicines, foods, dyes, or preservatives  pregnant or trying to get pregnant  breast-feeding How should I use this medicine? This medicine is for injection under the skin. Use exactly as directed. It is important to follow the directions given to you by your health care professional or doctor. If you are using Novolog, you should start your meal within 5 to 10 minutes after injection. If you are using Fiasp, you should start your meal at the time of injection or within 20 minutes after injection. Have food ready before injection. Do not delay eating. You will be taught how to use this medicine and how to adjust doses for activities and illness. Do not use more insulin than prescribed. Do not use more or less often than prescribed. Always check the appearance of your insulin before using it. This medicine should be clear and colorless like water. Do not use if it is cloudy, thickened, colored, or has solid particles in it. If you use a pen, be sure to take off the outer needle cover before using the dose. It is important that you put your used needles and syringes in a special sharps container. Do not put them in a trash can. If you do not have a sharps container, call your pharmacist or healthcare provider to get one. This drug comes with INSTRUCTIONS FOR USE. Ask your pharmacist for directions on how to use this drug. Read the information carefully. Talk to your pharmacist or health care provider if you have questions. Talk to your pediatrician regarding the use of this medicine in children. While this drug may be prescribed for children as young as 2 years for selected conditions, precautions do apply. Overdosage: If you think you have taken too much of this medicine contact a poison control center or emergency room at  once. NOTE: This medicine is only for you. Do not share this medicine with others. What if I miss a dose? It is important not to miss a dose. Your health care professional or doctor should discuss a plan for missed doses with you. If you do miss a dose, follow their plan. Do not take double doses. What  may interact with this medicine?  other medicines for diabetes Many medications may cause an increase or decrease in blood sugar, these include:  alcohol containing beverages  antiviral medicines for HIV or AIDS  aspirin and aspirin-like drugs  certain medicines for depression, anxiety, or psychotic disturbances  chromium  diuretics  female hormones, like estrogens or progestins and birth control pills  heart medicines  isoniazid  MAOIs like Carbex, Eldepryl, Marplan, Nardil, and Parnate  female hormones or anabolic steroids  medicines for weight loss  medicines for allergies, asthma, cold, or cough  niacin  NSAIDs, medicines for pain and inflammation, like ibuprofen or naproxen  octreotide  pentamidine  phenytoin  probenecid  quinolone antibiotics like ciprofloxacin, levofloxacin, ofloxacin  some herbal dietary supplements  steroid medicines like prednisone or cortisone  sulfamethoxazole; trimethoprim  thyroid medicine Some medications can hide the warning symptoms of low blood sugar. You may need to monitor your blood sugar more closely if you are taking one of these medications. These include:  beta-blockers such as atenolol, metoprolol, propranolol  clonidine  guanethidine  reserpine This list may not describe all possible interactions. Give your health care provider a list of all the medicines, herbs, non-prescription drugs, or dietary supplements you use. Also tell them if you smoke, drink alcohol, or use illegal drugs. Some items may interact with your medicine. What should I watch for while using this medicine? Visit your health care professional  or doctor for regular checks on your progress. A test called the HbA1C (A1C) will be monitored. This is a simple blood test. It measures your blood sugar control over the last 2 to 3 months. You will receive this test every 3 to 6 months. Learn how to check your blood sugar. Learn the symptoms of low and high blood sugar and how to manage them. Always carry a quick-source of sugar with you in case you have symptoms of low blood sugar. Examples include hard sugar candy or glucose tablets. Make sure others know that you can choke if you eat or drink when you develop serious symptoms of low blood sugar, such as seizures or unconsciousness. They must get medical help at once. Tell your doctor or health care professional if you have high blood sugar. You might need to change the dose of your medicine. If you are sick or exercising more than usual, you might need to change the dose of your medicine. Do not skip meals. Ask your doctor or health care professional if you should avoid alcohol. Many nonprescription cough and cold products contain sugar or alcohol. These can affect blood sugar. Make sure that you have the right kind of syringe for the type of insulin you use. Try not to change the brand and type of insulin or syringe unless your health care professional or doctor tells you to. Switching insulin brand or type can cause dangerously high or low blood sugar. Always keep an extra supply of insulin, syringes, and needles on hand. Use a syringe one time only. Throw away syringe and needle in a closed container to prevent accidental needle sticks. Insulin pens and cartridges should never be shared. Even if the needle is changed, sharing may result in passing of viruses like hepatitis or HIV. Each time you get a new box of pen needles, check to see if they are the same type as the ones you were trained to use. If not, ask your health care professional to show you how to use this new type properly. Wear  a  medical ID bracelet or chain, and carry a card that describes your disease and details of your medicine and dosage times. What side effects may I notice from receiving this medicine? Side effects that you should report to your doctor or health care professional as soon as possible:  allergic reactions like skin rash, itching or hives, swelling of the face, lips, or tongue  breathing problems  signs and symptoms of high blood sugar such as dizziness, dry mouth, dry skin, fruity breath, nausea, stomach pain, increased hunger or thirst, increased urination  signs and symptoms of low blood sugar such as feeling anxious, confusion, dizziness, increased hunger, unusually weak or tired, sweating, shakiness, cold, irritable, headache, blurred vision, fast heartbeat, loss of consciousness Side effects that usually do not require medical attention (report to your doctor or health care professional if they continue or are bothersome):  increase or decrease in fatty tissue under the skin due to overuse of a particular injection site  itching, burning, swelling, or rash at site where injected This list may not describe all possible side effects. Call your doctor for medical advice about side effects. You may report side effects to FDA at 1-800-FDA-1088. Where should I keep my medicine? Keep out of the reach of children. Unopened Vials: Novolog Vials: Store in a refrigerator between 2 and 8 degrees C (36 and 46 degrees F) or at room temperature below 30 degrees C (86 degrees F). Do not freeze or use if the insulin has been frozen. Protect from light and excessive heat. If stored at room temperature, the vial must be discarded after 28 days. Throw away any unopened and unused medicine that has been stored in the refrigerator after the expiration date. Fiasp Vials: Store in a refrigerator between 2 and 8 degrees C (36 and 46 degrees F) or at room temperature below 30 degrees C (86 degrees F). Do not freeze or  use if the insulin has been frozen. Protect from light and excessive heat. If stored at room temperature, the vial must be discarded after 28 days. Throw away any unopened and unused medicine that has been stored in the refrigerator after the expiration date. Unopened Pens and Cartridges: Novolog Flexpens and cartridges: Store in a refrigerator between 2 and 8 degrees C (36 and 46 degrees F) or at room temperature below 30 degrees C (86 degrees F). Do not freeze or use if the insulin has been frozen. Protect from light and excessive heat. If stored at room temperature, the pen or cartridge must be discarded after 28 days. Throw away any unopened and unused medicine that has been stored in the refrigerator after the expiration date. Fiasp FlexTouch pens: Store in a refrigerator between 2 and 8 degrees C (36 and 46 degrees F) or at room temperature below 30 degrees C (86 degrees F). Do not freeze or use if the insulin has been frozen. Protect from light and excessive heat. If stored at room temperature, the pen must be discarded after 28 days. Throw away any unopened and unused medicine that has been stored in the refrigerator after the expiration date. Fiasp FlexTouch cartridges: Store at room temperature below 30 degrees C (86 degrees F). Do not refrigerate or freeze. Keep away from heat and light. Throw the cartridge away after 28 days, even if it still has insulin left in it. Vials that you are using: Novolog Vials: Store in the refrigerator or at room temperature below 30 degrees C (86 degrees F). Do not freeze.  Keep away from heat and light. Throw the opened vial away after 28 days. Fiasp Vials: Store in the refrigerator or at room temperature below 30 degrees C (86 degrees F). Do not freeze. Keep away from heat and light. Throw the opened vial away after 28 days. Pens and cartridges that you are using: Novolog Flexpens and cartridges: Store at room temperature below 30 degrees C (86 degrees F). Do not  refrigerate or freeze. Keep away from heat and light. Throw away the pen or cartridge after 28 days, even if it still has insulin left in it. Fiasp FlexTouch pens: Store in the refrigerator or at room temperature below 30 degrees C (86 degrees F). Do not freeze. Keep away from heat and light. Throw the pen away after 28 days, even if it still has insulin left in it. Fiasp FlexTouch cartridges: Store at room temperature below 30 degrees C (86 degrees F). Do not refrigerate or freeze. Keep away from heat and light. Throw the cartridge away after 28 days, even if it still has insulin left in it. NOTE: This sheet is a summary. It may not cover all possible information. If you have questions about this medicine, talk to your doctor, pharmacist, or health care provider.  2020 Elsevier/Gold Standard (2018-12-16 08:02:23)

## 2020-02-17 NOTE — Progress Notes (Signed)
[facility]   Established Patient Office Visit  Subjective:  Patient ID: Sarah Meadows, female    DOB: 11/23/1965  Age: 54 y.o. MRN: [id number]  CC:  Chief Complaint  Patient presents with  . Follow-up    Pt states she is having some blurry vision, headaches and alittle confusion and some nose bleeds. X 4-5 days.    HPI Sarah Meadows is a 54 year old female who presents for Follow Up today.   Patient Active Problem List   Diagnosis Date Noted  . Bilateral sacroiliitis ([place]) 10/07/2018  . Controlled substance agreement signed 08/01/2018  . Restless leg 04/01/2018  . History of diverticulitis 03/28/2018  . Obesity (BMI 30.0-34.9) 03/28/2018  . Crohn's disease ([place]) 04/11/2017    Current Status: Since her last office visit, she is doing well with no complaints. She has c/o headaches and blurry vision, which she was told is an adverse side effects of Ustekinumab (Stelara). She is continuing to have increasing cough. She is no longer taking Atorvastatin. She denies fevers, chills, fatigue, recent infections, weight loss, and night sweats. She has not had any headaches, visual changes, dizziness, and falls. No chest pain, heart palpitations, cough and shortness of breath reported. Denies GI problems such as nausea, vomiting, diarrhea, and constipation. She has no reports of blood in stools, dysuria and hematuria. No depression or anxiety, and denies suicidal ideations, homicidal ideations, or auditory hallucinations. She is taking all medications as prescribed. She denies pain today.   Past Medical History:  Diagnosis Date  . Crohn's disease ([place]) 2006  . Crohn's disease of both small and large intestine ([place])   . Hyperlipidemia 01/2020  . Lichen planus   . Restless leg syndrome   . Sacroiliac inflammation [facility])     Past Surgical History:  Procedure Laterality Date  . CESAREAN SECTION    . CHOLECYSTECTOMY    . COLONOSCOPY   04/2018    Family History  Problem Relation Age of Onset  . Colon cancer Neg Hx   . Pancreatic cancer Neg Hx   . Stomach cancer Neg Hx   . Colon polyps Neg Hx   . Esophageal cancer Neg Hx   . Rectal cancer Neg Hx     Social History   Socioeconomic History  . Marital status: Single    Spouse name: Not on file  . Number of children: Not on file  . Years of education: Not on file  . Highest education level: Not on file  Occupational History  . Not on file  Tobacco Use  . Smoking status: Never Smoker  . Smokeless tobacco: Never Used  Vaping Use  . Vaping Use: Never used  Substance and Sexual Activity  . Alcohol use: Not Currently  . Drug use: Never  . Sexual activity: Not on file  Other Topics Concern  . Not on file  Social History Narrative  . Not on file   Social Determinants of Health   Financial Resource Strain:   . Difficulty of Paying Living Expenses: Not on file  Food Insecurity:   . Worried About Charity fundraiser in the Last Year: Not on file  . Ran Out of Food in the Last Year: Not on file  Transportation Needs:   . Lack of Transportation (Medical): Not on file  . Lack of Transportation (Non-Medical): Not on file  Physical Activity:   . Days of Exercise per Week: Not on file  .  Minutes of Exercise per Session: Not on file  Stress:   . Feeling of Stress : Not on file  Social Connections:   . Frequency of Communication with Friends and Family: Not on file  . Frequency of Social Gatherings with Friends and Family: Not on file  . Attends Religious Services: Not on file  . Active Member of Clubs or Organizations: Not on file  . Attends Archivist Meetings: Not on file  . Marital Status: Not on file  Intimate Partner Violence:   . Fear of Current or Ex-Partner: Not on file  . Emotionally Abused: Not on file  . Physically Abused: Not on file  . Sexually Abused: Not on file    Outpatient Medications Prior to Visit  Medication Sig Dispense  Refill  . albuterol (VENTOLIN HFA) 108 (90 Base) MCG/ACT inhaler Inhale 2 puffs into the lungs every 6 (six) hours as needed for wheezing or shortness of breath. 8 g 11  . clobetasol cream (TEMOVATE) 2.45 % Apply 1 application topically 2 (two) times daily.     . ondansetron (ZOFRAN-ODT) 4 MG disintegrating tablet Take 1 tablet (4 mg total) by mouth every 6 (six) hours as needed for nausea or vomiting. 30 tablet 3  . sucralfate (CARAFATE) 1 g tablet TAKE 1 TABLET BY MOUTH EVERY 8 HOURS AS NEEDED. SLOWLY DISSOLVE TABLET IN 1 TABLESPOON OF DISTILLED WATER PRIOR TO INGESTING 180 tablet 1  . triamcinolone cream (KENALOG) 0.1 % Apply 1 application topically 2 (two) times daily.     . famotidine (PEPCID) 20 MG tablet Take 1 tablet (20 mg total) by mouth 2 (two) times daily as needed for heartburn or indigestion. 60 tablet 6  . atorvastatin (LIPITOR) 10 MG tablet Take 1 tablet (10 mg total) by mouth daily. (Patient not taking: Reported on 02/17/2020) 90 tablet 3  . ustekinumab (STELARA) 90 MG/ML SOSY injection Inject 1 ml (90 mg) into the skin every 8 weeks. 2 mL 3  . dexlansoprazole (DEXILANT) 60 MG capsule Take 1 capsule (60 mg total) by mouth daily. 20 capsule 0   No facility-administered medications prior to visit.    Allergies  Allergen Reactions  . Adalimumab Rash    ROS Review of Systems  Constitutional: Negative.   HENT: Negative.   Eyes: Negative.   Respiratory: Negative.   Cardiovascular: Negative.   Gastrointestinal: Negative.   Endocrine: Negative.   Genitourinary: Negative.   Musculoskeletal: Negative.   Skin: Negative.   Allergic/Immunologic: Negative.   Neurological: Negative.   Hematological: Negative.   Psychiatric/Behavioral: Negative.       Objective:    Physical Exam Vitals and nursing note reviewed.  Constitutional:      Appearance: Normal appearance.  HENT:     Head: Normocephalic and atraumatic.     Nose: Nose normal.     Mouth/Throat:     Mouth: Mucous  membranes are dry.  Cardiovascular:     Rate and Rhythm: Normal rate.     Pulses: Normal pulses.  Pulmonary:     Effort: Pulmonary effort is normal.  Abdominal:     General: Bowel sounds are normal.     Palpations: Abdomen is soft.  Musculoskeletal:        General: Normal range of motion.     Cervical back: Normal range of motion.  Skin:    General: Skin is warm and dry.  Neurological:     General: No focal deficit present.     Mental Status: She is alert  and oriented to person, place, and time.  Psychiatric:        Mood and Affect: Mood normal.        Behavior: Behavior normal.        Thought Content: Thought content normal.        Judgment: Judgment normal.     BP 114/65 (BP Location: Right Arm, Patient Position: Sitting, Cuff Size: Large)   Pulse 85   Temp (!) 97.5 F (36.4 C)   Resp 16   Ht _0  (1.549 m)   Wt 205 lb 9.6 oz (93.3 kg)   SpO2 98%   BMI 38.85 kg/m  Wt Readings from Last 3 Encounters:  02/17/20 205 lb 9.6 oz (93.3 kg)  01/12/20 202 lb 4.8 oz (91.8 kg)  12/29/19 206 lb (93.4 kg)     Health Maintenance Due  Topic Date Due  . URINE MICROALBUMIN  Never done  . COVID-19 Vaccine (1) Never done  . HIV Screening  Never done  . PAP SMEAR-Modifier  Never done  . MAMMOGRAM  Never done  . INFLUENZA VACCINE  11/15/2019    There are no preventive care reminders to display for this patient.  Lab Results  Component Value Date   TSH 1.740 01/12/2020   Lab Results  Component Value Date   WBC 6.4 10/22/2019   HGB 12.6 10/22/2019   HCT 37.8 10/22/2019   MCV 87.2 10/22/2019   PLT 289.0 10/22/2019   Lab Results  Component Value Date   NA 137 10/22/2019   K 3.9 10/22/2019   CO2 28 10/22/2019   GLUCOSE 85 10/22/2019   BUN 9 10/22/2019   CREATININE 0.71 10/22/2019   BILITOT 0.4 10/22/2019   ALKPHOS 82 10/22/2019   AST 23 10/22/2019   ALT 22 10/22/2019   PROT 7.9 10/22/2019   ALBUMIN 4.1 10/22/2019   CALCIUM 9.3 10/22/2019   ANIONGAP 10  05/06/2019   GFR 104.17 10/22/2019   Lab Results  Component Value Date   CHOL 246 (H) 01/12/2020   Lab Results  Component Value Date   HDL 46 01/12/2020   Lab Results  Component Value Date   LDLCALC 158 (H) 01/12/2020   Lab Results  Component Value Date   TRIG 228 (H) 01/12/2020   Lab Results  Component Value Date   CHOLHDL 5.3 (H) 01/12/2020   Lab Results  Component Value Date   HGBA1C 10.1 (A) 01/12/2020   HGBA1C 10.1 01/12/2020   HGBA1C 10.1 (A) 01/12/2020   HGBA1C 10.1 (A) 01/12/2020      Assessment & Plan:   1. Newly diagnosed diabetes ([place]) - metFORMIN (GLUCOPHAGE) 1000 MG tablet; Take 1 tablet (1,000 mg total) by mouth 2 (two) times daily with a meal.  Dispense: 180 tablet; Refill: 3 - glipiZIDE (GLUCOTROL) 10 MG tablet; Take 1 tablet (10 mg total) by mouth 2 (two) times daily before a meal.  Dispense: 60 tablet; Refill: 3 - insulin aspart (NOVOLOG) 100 UNIT/ML injection; Inject 10 Units into the skin 3 (three) times daily before meals.  Dispense: 10 mL; Refill: PRN - blood glucose meter kit and supplies; Dispense based on patient and insurance preference. Use up to four times daily as directed. (FOR ICD-10 E10.9, E11.9).  Dispense: 1 each; Refill: 0  2. Type 2 diabetes mellitus with hyperglycemia, without long-term current use of insulin (HCC) She will continue medication as prescribed, to decrease foods/beverages high in sugars and carbs and follow Heart Healthy or DASH diet. Increase physical activity to at  least 30 minutes cardio exercise daily.  - metFORMIN (GLUCOPHAGE) 1000 MG tablet; Take 1 tablet (1,000 mg total) by mouth 2 (two) times daily with a meal.  Dispense: 180 tablet; Refill: 3 - glipiZIDE (GLUCOTROL) 10 MG tablet; Take 1 tablet (10 mg total) by mouth 2 (two) times daily before a meal.  Dispense: 60 tablet; Refill: 3 - insulin aspart (NOVOLOG) 100 UNIT/ML injection; Inject 10 Units into the skin 3 (three) times daily before meals.  Dispense: 10 mL;  Refill: PRN - blood glucose meter kit and supplies; Dispense based on patient and insurance preference. Use up to four times daily as directed. (FOR ICD-10 E10.9, E11.9).  Dispense: 1 each; Refill: 0  3. Cough - amoxicillin-clavulanate (AUGMENTIN) 875-125 MG tablet; Take 1 tablet by mouth 2 (two) times daily for 10 days.  Dispense: 20 tablet; Refill: 0  4. Gastroesophageal reflux disease without esophagitis - famotidine (PEPCID) 20 MG tablet; Take 1 tablet (20 mg total) by mouth 2 (two) times daily as needed for heartburn or indigestion.  Dispense: 60 tablet; Refill: 6  5. Crohn's disease with complication, unspecified gastrointestinal tract location ([place]) Stable today. She continues to follow up with GI as needed. Monitor.   6. Healthcare maintenance - Flu Vaccine QUAD 6+ mos PF IM (Fluarix Quad PF)  7. Follow up She will follow up in 1 month.   Meds ordered this encounter  Medications  . amoxicillin-clavulanate (AUGMENTIN) 875-125 MG tablet    Sig: Take 1 tablet by mouth 2 (two) times daily for 10 days.    Dispense:  20 tablet    Refill:  0  . metFORMIN (GLUCOPHAGE) 1000 MG tablet    Sig: Take 1 tablet (1,000 mg total) by mouth 2 (two) times daily with a meal.    Dispense:  180 tablet    Refill:  3  . glipiZIDE (GLUCOTROL) 10 MG tablet    Sig: Take 1 tablet (10 mg total) by mouth 2 (two) times daily before a meal.    Dispense:  60 tablet    Refill:  3  . insulin aspart (NOVOLOG) 100 UNIT/ML injection    Sig: Inject 10 Units into the skin 3 (three) times daily before meals.    Dispense:  10 mL    Refill:  PRN  . DISCONTD: insulin glargine (LANTUS) 100 unit/mL SOPN    Sig: Inject 20 Units into the skin at bedtime.    Dispense:  18 mL    Refill:  1  . blood glucose meter kit and supplies    Sig: Dispense based on patient and insurance preference. Use up to four times daily as directed. (FOR ICD-10 E10.9, E11.9).    Dispense:  1 each    Refill:  0    Order Specific Question:    Number of strips    Answer:   100    Order Specific Question:   Number of lancets    Answer:   100  . famotidine (PEPCID) 20 MG tablet    Sig: Take 1 tablet (20 mg total) by mouth 2 (two) times daily as needed for heartburn or indigestion.    Dispense:  60 tablet    Refill:  6    Orders Placed This Encounter  Procedures  . Flu Vaccine QUAD 6+ mos PF IM (Fluarix Quad PF)    Referral Orders  No referral(s) requested today    Kathe Becton,  MSN, FNP-BC [facility]  [place], [place] [address] [phone] fax   Problem List Items Addressed This Visit      Digestive   Crohn's disease [facility])    Other Visit Diagnoses    Newly diagnosed diabetes ([place])    -  Primary   Relevant Medications   metFORMIN (GLUCOPHAGE) 1000 MG tablet   glipiZIDE (GLUCOTROL) 10 MG tablet   insulin aspart (NOVOLOG) 100 UNIT/ML injection   blood glucose meter kit and supplies   Type 2 diabetes mellitus with hyperglycemia, without long-term current use of insulin (HCC)       Relevant Medications   metFORMIN (GLUCOPHAGE) 1000 MG tablet   glipiZIDE (GLUCOTROL) 10 MG tablet   insulin aspart (NOVOLOG) 100 UNIT/ML injection   blood glucose meter kit and supplies   Cough       Relevant Medications   amoxicillin-clavulanate (AUGMENTIN) 875-125 MG tablet   Gastroesophageal reflux disease without esophagitis       Relevant Medications   famotidine (PEPCID) 20 MG tablet   Healthcare maintenance       Relevant Orders   Flu Vaccine QUAD 6+ mos PF IM (Fluarix Quad PF)   Follow up          Meds ordered this encounter  Medications  . amoxicillin-clavulanate (AUGMENTIN) 875-125 MG tablet    Sig: Take 1 tablet by mouth 2 (two) times daily for 10 days.    Dispense:  20 tablet    Refill:  0  . metFORMIN (GLUCOPHAGE) 1000 MG tablet    Sig: Take 1 tablet (1,000 mg total) by mouth 2 (two) times  daily with a meal.    Dispense:  180 tablet    Refill:  3  . glipiZIDE (GLUCOTROL) 10 MG tablet    Sig: Take 1 tablet (10 mg total) by mouth 2 (two) times daily before a meal.    Dispense:  60 tablet    Refill:  3  . insulin aspart (NOVOLOG) 100 UNIT/ML injection    Sig: Inject 10 Units into the skin 3 (three) times daily before meals.    Dispense:  10 mL    Refill:  PRN  . DISCONTD: insulin glargine (LANTUS) 100 unit/mL SOPN    Sig: Inject 20 Units into the skin at bedtime.    Dispense:  18 mL    Refill:  1  . blood glucose meter kit and supplies    Sig: Dispense based on patient and insurance preference. Use up to four times daily as directed. (FOR ICD-10 E10.9, E11.9).    Dispense:  1 each    Refill:  0    Order Specific Question:   Number of strips    Answer:   100    Order Specific Question:   Number of lancets    Answer:   100  . famotidine (PEPCID) 20 MG tablet    Sig: Take 1 tablet (20 mg total) by mouth 2 (two) times daily as needed for heartburn or indigestion.    Dispense:  60 tablet    Refill:  6    Follow-up: No follow-ups on file.    Azzie Glatter, FNP

## 2020-02-18 ENCOUNTER — Telehealth: Payer: Self-pay | Admitting: Family Medicine

## 2020-02-19 ENCOUNTER — Other Ambulatory Visit: Payer: Self-pay | Admitting: Nurse Practitioner

## 2020-02-19 DIAGNOSIS — E119 Type 2 diabetes mellitus without complications: Secondary | ICD-10-CM

## 2020-02-19 DIAGNOSIS — E1165 Type 2 diabetes mellitus with hyperglycemia: Secondary | ICD-10-CM

## 2020-02-19 MED ORDER — INSULIN DETEMIR 100 UNIT/ML FLEXPEN: 20.0000 [IU] | PEN_INJECTOR | Freq: Every day | SUBCUTANEOUS | 11 refills | Status: DC

## 2020-02-19 MED ORDER — "PEN NEEDLES 5/16"" 30G X 8 MM MISC": 1.0000 | Freq: Every day | 11 refills | Status: AC

## 2020-02-19 MED ORDER — INSULIN SYRINGES (DISPOSABLE) U-100 0.5 ML MISC: 1.0000 | Freq: Three times a day (TID) | 11 refills | Status: AC

## 2020-02-19 NOTE — Progress Notes (Signed)
   [address] [place], [place]  [address] Phone:  [phone]   Fax:  [phone]  Current insurance carrier will not cover Lantus patient needing alternative.  Levemir 100 units/mL 20 units nightly sent to local pharmacy

## 2020-02-19 NOTE — Telephone Encounter (Signed)
Spoke with patient on the phone Lantus changed to Levemir flex pen per insurance

## 2020-02-22 ENCOUNTER — Encounter: Payer: Self-pay | Admitting: Family Medicine

## 2020-02-23 ENCOUNTER — Encounter: Payer: Self-pay | Admitting: Family Medicine

## 2020-03-18 ENCOUNTER — Ambulatory Visit: Payer: BC Managed Care – PPO | Admitting: Family Medicine

## 2020-05-13 ENCOUNTER — Ambulatory Visit: Payer: BC Managed Care – PPO | Attending: Internal Medicine

## 2020-05-13 ENCOUNTER — Other Ambulatory Visit (HOSPITAL_BASED_OUTPATIENT_CLINIC_OR_DEPARTMENT_OTHER): Payer: Self-pay | Admitting: Internal Medicine

## 2020-05-13 DIAGNOSIS — Z23 Encounter for immunization: Secondary | ICD-10-CM

## 2020-05-13 MED FILL — PFIZER-BIONTECH COVID-19 VA: 30 | 21 days supply | Qty: 0 | Fill #0

## 2020-05-13 NOTE — Progress Notes (Signed)
   Covid-19 Vaccination Clinic  Name:  Sarah Meadows    MRN: [id number] DOB: Jul 12, 1965  05/13/2020  Sarah Meadows was observed post Covid-19 immunization for 15 minutes without incident. She was provided with Vaccine Information Sheet and instruction to access the V-Safe system.   Sarah Meadows was instructed to call 911 with any severe reactions post vaccine: Marland Kitchen Difficulty breathing  . Swelling of face and throat  . A fast heartbeat  . A bad rash all over body  . Dizziness and weakness   Immunizations Administered    Name Date Dose VIS Date Route   Pfizer COVID-19 Vaccine 05/13/2020  1:41 PM 0.3 mL 02/03/2020 Intramuscular   Manufacturer: [place]   Lot: [id number]   [place]: [id number]

## 2020-05-24 ENCOUNTER — Other Ambulatory Visit: Payer: Self-pay | Admitting: Family Medicine

## 2020-05-24 DIAGNOSIS — R0602 Shortness of breath: Secondary | ICD-10-CM

## 2020-05-24 MED ORDER — ALBUTEROL SULFATE HFA 108 (90 BASE) MCG/ACT IN AERS: 2.0000 | INHALATION_SPRAY | Freq: Four times a day (QID) | RESPIRATORY_TRACT | 11 refills | Status: DC | PRN

## 2020-07-08 ENCOUNTER — Telehealth: Payer: Self-pay | Admitting: Family Medicine

## 2020-07-08 NOTE — Telephone Encounter (Signed)
Done

## 2020-09-01 ENCOUNTER — Encounter: Payer: Self-pay | Admitting: Nurse Practitioner

## 2020-09-01 ENCOUNTER — Other Ambulatory Visit: Payer: Self-pay | Admitting: Nurse Practitioner

## 2020-09-01 ENCOUNTER — Other Ambulatory Visit: Payer: Self-pay

## 2020-09-01 ENCOUNTER — Ambulatory Visit: Payer: BC Managed Care – PPO | Admitting: Nurse Practitioner

## 2020-09-01 VITALS — BP 116/69 | HR 51 | Temp 97.5°F | Ht 61.0 in | Wt 198.0 lb

## 2020-09-01 DIAGNOSIS — Z13220 Encounter for screening for lipoid disorders: Secondary | ICD-10-CM

## 2020-09-01 DIAGNOSIS — Z0000 Encounter for general adult medical examination without abnormal findings: Secondary | ICD-10-CM | POA: Diagnosis not present

## 2020-09-01 DIAGNOSIS — K50919 Crohn's disease, unspecified, with unspecified complications: Secondary | ICD-10-CM

## 2020-09-01 DIAGNOSIS — E1165 Type 2 diabetes mellitus with hyperglycemia: Secondary | ICD-10-CM | POA: Diagnosis not present

## 2020-09-01 LAB — POCT URINALYSIS DIPSTICK
Bilirubin, UA: NEGATIVE
Blood, UA: NEGATIVE
Glucose, UA: NEGATIVE
Ketones, UA: NEGATIVE
Leukocytes, UA: NEGATIVE
Nitrite, UA: NEGATIVE
Protein, UA: NEGATIVE
Spec Grav, UA: >=1.03 — AB (ref 1.010–1.025)
Urobilinogen, UA: 0.2 E.U./dL
pH, UA: 5.5 (ref 5.0–8.0)

## 2020-09-01 LAB — POCT GLYCOSYLATED HEMOGLOBIN (HGB A1C)
HbA1c POC (<> result, manual entry): 7.4 % (ref 4.0–5.6)
HbA1c, POC (controlled diabetic range): 7.4 % — AB (ref 0.0–7.0)
HbA1c, POC (prediabetic range): 7.4 % — AB (ref 5.7–6.4)
Hemoglobin A1C: 7.4 % — AB (ref 4.0–5.6)

## 2020-09-01 LAB — GLUCOSE, POCT (MANUAL RESULT ENTRY): POC Glucose: 114 mg/dl — AB (ref 70–99)

## 2020-09-01 NOTE — Patient Instructions (Signed)
[facility] Eating Following a healthy eating pattern may help you to achieve and maintain a healthy body weight, reduce the risk of chronic disease, and live a long and productive life. It is important to follow a healthy eating pattern at an appropriate calorie level for your body. Your nutritional needs should be met primarily through food by choosing a variety of nutrient-rich foods. What are tips for following this plan? Reading food labels  Read labels and choose the following: ? Reduced or low sodium. ? Juices with 100% fruit juice. ? Foods with low saturated fats and high polyunsaturated and monounsaturated fats. ? Foods with whole grains, such as whole wheat, cracked wheat, brown rice, and wild rice. ? Whole grains that are fortified with folic acid. This is recommended for women who are pregnant or who want to become pregnant.  Read labels and avoid the following: ? Foods with a lot of added sugars. These include foods that contain brown sugar, corn sweetener, corn syrup, dextrose, fructose, glucose, high-fructose corn syrup, honey, invert sugar, lactose, malt syrup, maltose, molasses, raw sugar, sucrose, trehalose, or turbinado sugar.  Do not eat more than the following amounts of added sugar per day:  6 teaspoons (25 g) for women.  9 teaspoons (38 g) for men. ? Foods that contain processed or refined starches and grains. ? Refined grain products, such as white flour, degermed cornmeal, white bread, and white rice. Shopping  Choose nutrient-rich snacks, such as vegetables, whole fruits, and nuts. Avoid high-calorie and high-sugar snacks, such as potato chips, fruit snacks, and candy.  Use oil-based dressings and spreads on foods instead of solid fats such as butter, stick margarine, or cream cheese.  Limit pre-made sauces, mixes, and "instant" products such as flavored rice, instant noodles, and ready-made pasta.  Try more plant-protein sources, such as tofu, tempeh, black  beans, edamame, lentils, nuts, and seeds.  Explore eating plans such as the Mediterranean diet or vegetarian diet. Cooking  Use oil to saut or stir-fry foods instead of solid fats such as butter, stick margarine, or lard.  Try baking, boiling, grilling, or broiling instead of frying.  Remove the fatty part of meats before cooking.  Steam vegetables in water or broth. Meal planning  At meals, imagine dividing your plate into fourths: ? One-half of your plate is fruits and vegetables. ? One-fourth of your plate is whole grains. ? One-fourth of your plate is protein, especially lean meats, poultry, eggs, tofu, beans, or nuts.  Include low-fat dairy as part of your daily diet.   Lifestyle  Choose healthy options in all settings, including home, work, school, restaurants, or stores.  Prepare your food safely: ? Wash your hands after handling raw meats. ? Keep food preparation surfaces clean by regularly washing with hot, soapy water. ? Keep raw meats separate from ready-to-eat foods, such as fruits and vegetables. ? Cook seafood, meat, poultry, and eggs to the recommended internal temperature. ? Store foods at safe temperatures. In general:  Keep cold foods at 45F (4.4C) or below.  Keep hot foods at 145F (60C) or above.  Keep your freezer at [facility] (-17.8C) or below.  Foods are no longer safe to eat when they have been between the temperatures of 40-145F (4.4-60C) for more than 2 hours. What foods should I eat? Fruits Aim to eat 2 cup-equivalents of fresh, canned (in natural juice), or frozen fruits each day. Examples of 1 cup-equivalent of fruit include 1 small apple, 8 large strawberries, 1 cup canned fruit,  cup dried fruit, or 1 cup 100% juice. Vegetables Aim to eat 2-3 cup-equivalents of fresh and frozen vegetables each day, including different varieties and colors. Examples of 1 cup-equivalent of vegetables include 2 medium carrots, 2 cups raw, leafy greens, 1 cup chopped  vegetable (raw or cooked), or 1 medium baked potato. Grains Aim to eat 6 ounce-equivalents of whole grains each day. Examples of 1 ounce-equivalent of grains include 1 slice of bread, 1 cup ready-to-eat cereal, 3 cups popcorn, or  cup cooked rice, pasta, or cereal. Meats and other proteins Aim to eat 5-6 ounce-equivalents of protein each day. Examples of 1 ounce-equivalent of protein include 1 egg, 1/2 cup nuts or seeds, or 1 tablespoon (16 g) peanut butter. A cut of meat or fish that is the size of a deck of cards is about 3-4 ounce-equivalents.  Of the protein you eat each week, try to have at least 8 ounces come from seafood. This includes salmon, trout, herring, and anchovies. Dairy Aim to eat 3 cup-equivalents of fat-free or low-fat dairy each day. Examples of 1 cup-equivalent of dairy include 1 cup (240 mL) milk, 8 ounces (250 g) yogurt, 1 ounces (44 g) natural cheese, or 1 cup (240 mL) fortified soy milk. Fats and oils  Aim for about 5 teaspoons (21 g) per day. Choose monounsaturated fats, such as canola and olive oils, avocados, peanut butter, and most nuts, or polyunsaturated fats, such as sunflower, corn, and soybean oils, walnuts, pine nuts, sesame seeds, sunflower seeds, and flaxseed. Beverages  Aim for six 8-oz glasses of water per day. Limit coffee to three to five 8-oz cups per day.  Limit caffeinated beverages that have added calories, such as soda and energy drinks.  Limit alcohol intake to no more than 1 drink a day for nonpregnant women and 2 drinks a day for men. One drink equals 12 oz of beer (355 mL), 5 oz of wine (148 mL), or 1 oz of hard liquor (44 mL). Seasoning and other foods  Avoid adding excess amounts of salt to your foods. Try flavoring foods with herbs and spices instead of salt.  Avoid adding sugar to foods.  Try using oil-based dressings, sauces, and spreads instead of solid fats. This information is based on general [place] nutrition guidelines. For more  information, visit choosemyplate.gov. Exact amounts may vary based on your nutrition needs. Summary  A healthy eating plan may help you to maintain a healthy weight, reduce the risk of chronic diseases, and stay active throughout your life.  Plan your meals. Make sure you eat the right portions of a variety of nutrient-rich foods.  Try baking, boiling, grilling, or broiling instead of frying.  Choose healthy options in all settings, including home, work, school, restaurants, or stores. This information is not intended to replace advice given to you by your health care provider. Make sure you discuss any questions you have with your health care provider. Document Revised: 07/15/2017 Document Reviewed: 07/15/2017 Elsevier Patient Education  2021 [employer].  

## 2020-09-01 NOTE — Progress Notes (Signed)
[place], [place]  [address] Phone:  [phone]   Fax:  [phone]     Established Patient Office Visit  Subjective:  Patient ID: Sarah Meadows, female    DOB: 1965/10/15  Age: 55 y.o. MRN: [id number]  CC:  Chief Complaint  Patient presents with  . Follow-up    Rash bottom of stomach. Has not taken any diabetic medication since it was prescribed.     HPI Noele Icenhour presents for follow up. She has been out.   She has crohns and has decided to discontinue all her medication. She was being treated for DM . She has stopped her medication;. She reports her medications made her feel worse. She made some lifestyle changes and feels like she is doing better.   Rash Patient presents for evaluation of a boil nvolving the acrum. Rash started a few weeks agoShe has seen the rash since her COVID booster. She has rash that dries up and itchy. She has been trimaniciolone and clobetasol  which was not effective.   Past Medical History:  Diagnosis Date  . Crohn's disease ([place]) 2006  . Crohn's disease of both small and large intestine ([place])   . Hyperlipidemia 01/2020  . Lichen planus   . Restless leg syndrome   . 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 CESAREAN SECTION    . CHOLECYSTECTOMY    . COLONOSCOPY  04/2018    Family History  Problem Relation Age of Onset  . Colon cancer Neg Hx   . Pancreatic cancer Neg Hx   . Stomach cancer Neg Hx   . Colon polyps Neg Hx   . Esophageal cancer Neg Hx   . Rectal cancer Neg Hx     Social History   Socioeconomic History  . Marital status: Single    Spouse name: Not on file  . Number of children: Not on file  . Years of education: Not on file  . Highest education level: Not on file  Occupational History  . Not on file  Tobacco Use  . Smoking status: Never Smoker  . Smokeless tobacco: Never Used  Vaping Use  . Vaping Use: Never used   Substance and Sexual Activity  . Alcohol use: Not Currently  . Drug use: Never  . Sexual activity: Not on file  Other Topics Concern  . Not on file  Social History Narrative  . Not on file   Social Determinants of Health   Financial Resource Strain: Not on file  Food Insecurity: Not on file  Transportation Needs: Not on file  Physical Activity: Not on file  Stress: Not on file  Social Connections: Not on file  Intimate Partner Violence: Not on file    Outpatient Medications Prior to Visit  Medication Sig Dispense Refill  . albuterol (VENTOLIN HFA) 108 (90 Base) MCG/ACT inhaler Inhale 2 puffs into the lungs every 6 (six) hours as needed for wheezing or shortness of breath. (Patient not taking: Reported on 09/01/2020) 8 g 11  . atorvastatin (LIPITOR) 10 MG tablet Take 1 tablet (10 mg total) by mouth daily. (Patient not taking: Reported on 09/01/2020) 90 tablet 3  . blood glucose meter kit and supplies Dispense based on patient and insurance preference. Use up to four times daily as directed. (FOR ICD-10 E10.9, E11.9). (Patient not taking: Reported on 09/01/2020) 1 each 0  . clobetasol cream (TEMOVATE) 0.05 % Apply  1 application topically 2 (two) times daily.  (Patient not taking: Reported on 09/01/2020)    . COVID-19 mRNA vaccine, Pfizer, 30 MCG/0.3ML injection INJECT AS DIRECTED (Patient not taking: Reported on 09/01/2020) .3 mL 0  . famotidine (PEPCID) 20 MG tablet Take 1 tablet (20 mg total) by mouth 2 (two) times daily as needed for heartburn or indigestion. (Patient not taking: Reported on 09/01/2020) 60 tablet 6  . glipiZIDE (GLUCOTROL) 10 MG tablet Take 1 tablet (10 mg total) by mouth 2 (two) times daily before a meal. (Patient not taking: Reported on 09/01/2020) 60 tablet 3  . insulin aspart (NOVOLOG) 100 UNIT/ML injection Inject 10 Units into the skin 3 (three) times daily before meals. (Patient not taking: Reported on 09/01/2020) 10 mL PRN  . insulin detemir (LEVEMIR) 100 UNIT/ML  FlexPen Inject 20 Units into the skin daily. (Patient not taking: Reported on 09/01/2020) 15 mL 11  . Insulin Pen Needle (PEN NEEDLES 5/16") 30G X 8 MM MISC 1 each by Does not apply route at bedtime. (Patient not taking: Reported on 09/01/2020) 100 each 11  . Insulin Syringes, Disposable, U-100 0.5 ML MISC 1 Syringe by Does not apply route 3 (three) times daily after meals. (Patient not taking: Reported on 09/01/2020) 100 each 11  . metFORMIN (GLUCOPHAGE) 1000 MG tablet Take 1 tablet (1,000 mg total) by mouth 2 (two) times daily with a meal. (Patient not taking: Reported on 09/01/2020) 180 tablet 3  . ondansetron (ZOFRAN-ODT) 4 MG disintegrating tablet Take 1 tablet (4 mg total) by mouth every 6 (six) hours as needed for nausea or vomiting. (Patient not taking: Reported on 09/01/2020) 30 tablet 3  . sucralfate (CARAFATE) 1 g tablet TAKE 1 TABLET BY MOUTH EVERY 8 HOURS AS NEEDED. SLOWLY DISSOLVE TABLET IN 1 TABLESPOON OF DISTILLED WATER PRIOR TO INGESTING (Patient not taking: Reported on 09/01/2020) 180 tablet 1  . triamcinolone cream (KENALOG) 0.1 % Apply 1 application topically 2 (two) times daily.  (Patient not taking: Reported on 09/01/2020)    . ustekinumab (STELARA) 90 MG/ML SOSY injection Inject 1 ml (90 mg) into the skin every 8 weeks. (Patient not taking: Reported on 09/01/2020) 2 mL 3   No facility-administered medications prior to visit.    Allergies  Allergen Reactions  . Adalimumab Rash    Other reaction(s): Unknown    ROS Review of Systems    Objective:    Physical Exam  BP 116/69 (BP Location: Left Arm, Patient Position: Sitting, Cuff Size: Large)   Pulse (!) 51   Temp (!) 97.5 F (36.4 C)   Ht _0  (1.549 m)   Wt 198 lb 0.4 oz (89.8 kg)   SpO2 100%   BMI 37.42 kg/m  Wt Readings from Last 3 Encounters:  09/01/20 198 lb 0.4 oz (89.8 kg)  02/17/20 205 lb 9.6 oz (93.3 kg)  01/12/20 202 lb 4.8 oz (91.8 kg)     Health Maintenance Due  Topic Date Due  . PAP SMEAR-Modifier   Never done  . COVID-19 Vaccine (2 - Pfizer 3-dose series) 06/03/2020    There are no preventive care reminders to display for this patient.  Lab Results  Component Value Date   TSH 1.740 01/12/2020   Lab Results  Component Value Date   WBC 6.4 10/22/2019   HGB 12.6 10/22/2019   HCT 37.8 10/22/2019   MCV 87.2 10/22/2019   PLT 289.0 10/22/2019   Lab Results  Component Value Date   NA 137 10/22/2019  K 3.9 10/22/2019   CO2 28 10/22/2019   GLUCOSE 85 10/22/2019   BUN 9 10/22/2019   CREATININE 0.71 10/22/2019   BILITOT 0.4 10/22/2019   ALKPHOS 82 10/22/2019   AST 23 10/22/2019   ALT 22 10/22/2019   PROT 7.9 10/22/2019   ALBUMIN 4.1 10/22/2019   CALCIUM 9.3 10/22/2019   ANIONGAP 10 05/06/2019   GFR 104.17 10/22/2019   Lab Results  Component Value Date   CHOL 246 (H) 01/12/2020   Lab Results  Component Value Date   HDL 46 01/12/2020   Lab Results  Component Value Date   LDLCALC 158 (H) 01/12/2020   Lab Results  Component Value Date   TRIG 228 (H) 01/12/2020   Lab Results  Component Value Date   CHOLHDL 5.3 (H) 01/12/2020   Lab Results  Component Value Date   HGBA1C 7.4 (A) 09/01/2020   HGBA1C 7.4 09/01/2020   HGBA1C 7.4 (A) 09/01/2020   HGBA1C 7.4 (A) 09/01/2020      Assessment & Plan:   Problem List Items Addressed This Visit      Digestive   Crohn's disease ([place]) Stable on no medication  Dietary changes    Other Visit Diagnoses    Healthcare maintenance    -  Primary   Relevant Orders   HgB A1c (Completed)   Glucose (CBG) (Completed)   Urinalysis Dipstick (Completed)   Type 2 diabetes mellitus with hyperglycemia, without long-term current use of insulin (HCC)     Controlled with diet and lifestyle    Relevant Orders   Comp. Metabolic Panel (12)   Microalbumin, urine   Screening for cholesterol level       Relevant Orders   Lipid panel      Meds ordered this encounter  Medications  . doxycycline (VIBRAMYCIN) 100 MG capsule     Sig: Take 1 capsule (100 mg total) by mouth 2 (two) times daily for 7 days.    Dispense:  14 capsule    Refill:  0    Order Specific Question:   Supervising Provider    Answer:   Tresa Garter [[id number]]    Follow-up: No follow-ups on file.    Vevelyn Francois, NP

## 2020-09-02 ENCOUNTER — Telehealth: Payer: Self-pay | Admitting: Nurse Practitioner

## 2020-09-02 LAB — LIPID PANEL
Chol/HDL Ratio: 5.9 ratio — ABNORMAL HIGH (ref 0.0–4.4)
Cholesterol, Total: 218 mg/dL — ABNORMAL HIGH (ref 100–199)
HDL: 37 mg/dL — ABNORMAL LOW (ref >39)
LDL Chol Calc (NIH): 150 mg/dL — ABNORMAL HIGH (ref 0–99)
Triglycerides: 170 mg/dL — ABNORMAL HIGH (ref 0–149)
VLDL Cholesterol Cal: 31 mg/dL (ref 5–40)

## 2020-09-02 LAB — COMP. METABOLIC PANEL (12)
AST: 18 IU/L (ref 0–40)
Albumin/Globulin Ratio: 1.2 (ref 1.2–2.2)
Albumin: 4.2 g/dL (ref 3.8–4.9)
Alkaline Phosphatase: 97 IU/L (ref 44–121)
BUN/Creatinine Ratio: 12 (ref 9–23)
BUN: 10 mg/dL (ref 6–24)
Bilirubin Total: 0.3 mg/dL (ref 0.0–1.2)
Calcium: 9.7 mg/dL (ref 8.7–10.2)
Chloride: 103 mmol/L (ref 96–106)
Creatinine, Ser: 0.81 mg/dL (ref 0.57–1.00)
Globulin, Total: 3.4 g/dL (ref 1.5–4.5)
Glucose: 89 mg/dL (ref 65–99)
Potassium: 4.1 mmol/L (ref 3.5–5.2)
Sodium: 140 mmol/L (ref 134–144)
Total Protein: 7.6 g/dL (ref 6.0–8.5)
eGFR: 87 mL/min/{1.73_m2} (ref >59)

## 2020-09-02 LAB — MICROALBUMIN, URINE: Microalbumin, Urine: 6 ug/mL

## 2020-09-02 MED ORDER — DOXYCYCLINE HYCLATE 100 MG PO CAPS: 100.0000 mg | ORAL_CAPSULE | Freq: Two times a day (BID) | ORAL | 0 refills | Status: AC

## 2020-09-02 NOTE — Telephone Encounter (Signed)
Patient aware.

## 2020-09-02 NOTE — Telephone Encounter (Signed)
Please make her aware the doxycyline has been sent

## 2020-09-04 ENCOUNTER — Encounter: Payer: Self-pay | Admitting: Nurse Practitioner

## 2020-09-05 ENCOUNTER — Telehealth: Payer: Self-pay

## 2020-09-05 NOTE — Telephone Encounter (Signed)
Called and spoke to patient.  Offered her an appointment with Dr. Havery Moros but she indicated she is doing fine and does not want to schedule an appointment at this time.   She said she would call us when she is ready to schedule an Office Visit with Dr. Havery Moros

## 2020-09-05 NOTE — Telephone Encounter (Signed)
-----   Message from Yetta Flock, MD sent at 09/05/2020  7:27 AM EDT ----- Sarah Meadows can you help coordinate an office follow up for this patient with me? Thanks  ----- Message ----- From: Vevelyn Francois, NP Sent: 09/04/2020   5:54 PM EDT To: Yetta Flock, MD

## 2020-09-05 NOTE — Telephone Encounter (Signed)
Okay. Her PCP sent me a message that she stopped her Crohn's medication, up to her if she wishes to receive care for that. Happy to see her if she changes her mind

## 2021-04-16 LAB — HM PAP SMEAR: HM Pap smear: NORMAL

## 2021-04-16 LAB — RESULTS CONSOLE HPV: CHL HPV: NEGATIVE

## 2021-06-27 ENCOUNTER — Encounter (HOSPITAL_COMMUNITY): Payer: Self-pay

## 2021-06-27 ENCOUNTER — Other Ambulatory Visit: Payer: Self-pay

## 2021-06-27 ENCOUNTER — Emergency Department (HOSPITAL_COMMUNITY)
Admission: EM | Admit: 2021-06-27 | Discharge: 2021-06-27 | Disposition: A | Discharge status: Home / Self Care | Payer: BC Managed Care – PPO | Attending: Emergency Medicine | Admitting: Emergency Medicine

## 2021-06-27 ENCOUNTER — Emergency Department (HOSPITAL_COMMUNITY): Payer: BC Managed Care – PPO

## 2021-06-27 DIAGNOSIS — Z7984 Long term (current) use of oral hypoglycemic drugs: Secondary | ICD-10-CM | POA: Diagnosis not present

## 2021-06-27 DIAGNOSIS — Z794 Long term (current) use of insulin: Secondary | ICD-10-CM | POA: Diagnosis not present

## 2021-06-27 DIAGNOSIS — M25562 Pain in left knee: Secondary | ICD-10-CM | POA: Diagnosis present

## 2021-06-27 DIAGNOSIS — M79662 Pain in left lower leg: Secondary | ICD-10-CM | POA: Insufficient documentation

## 2021-06-27 NOTE — ED Provider Notes (Signed)
?[facility] DEPT ?Provider Note ? ? ?CSN: [id number] ?Arrival date & time: 06/27/21  1839 ? ?  ? ?History ? ?Chief Complaint  ?Patient presents with  ? Leg Pain  ? ? ?Sarah Meadows is a 56 y.o. female.  She was doing a lot of traveling by plane on Sunday.  Since Sunday evening she has been having pain in her left knee and left calf.  Intermittently shooting and stabbing.  No known trauma.  No chest pain or shortness of breath.  No prior history of DVT. ? ?The history is provided by the patient.  ?Leg Pain ?Location:  Knee and leg ?Time since incident:  2 days ?Injury: no   ?Leg location:  L lower leg ?Knee location:  L knee ?Pain details:  ?  Quality:  Shooting and sharp ?  Severity:  Moderate ?  Onset quality:  Gradual ?  Timing:  Intermittent ?  Progression:  Unchanged ?Chronicity:  New ?Dislocation: no   ?Relieved by:  None tried ?Worsened by:  Bearing weight ?Ineffective treatments:  Rest ?Associated symptoms: no back pain, no decreased ROM, no fever, no muscle weakness, no numbness, no swelling and no tingling   ? ?  ? ?Home Medications ?Prior to Admission medications   ?Medication Sig Start Date End Date Taking? Authorizing Provider  ?albuterol (VENTOLIN HFA) 108 (90 Base) MCG/ACT inhaler Inhale 2 puffs into the lungs every 6 (six) hours as needed for wheezing or shortness of breath. ?Patient not taking: No sig reported 05/24/20   Azzie Glatter, FNP  ?atorvastatin (LIPITOR) 10 MG tablet Take 1 tablet (10 mg total) by mouth daily. ?Patient not taking: Reported on 09/01/2020 01/21/20   Azzie Glatter, FNP  ?blood glucose meter kit and supplies Dispense based on patient and insurance preference. Use up to four times daily as directed. (FOR ICD-10 E10.9, E11.9). ?Patient not taking: Reported on 09/01/2020 02/17/20   Azzie Glatter, FNP  ?clobetasol cream (TEMOVATE) 2.99 % Apply 1 application topically 2 (two) times daily.  ?Patient not taking: Reported on 09/01/2020    [provider]  ?famotidine (PEPCID) 20 MG tablet Take 1 tablet (20 mg total) by mouth 2 (two) times daily as needed for heartburn or indigestion. ?Patient not taking: Reported on 09/01/2020 02/17/20   Azzie Glatter, FNP  ?glipiZIDE (GLUCOTROL) 10 MG tablet Take 1 tablet (10 mg total) by mouth 2 (two) times daily before a meal. ?Patient not taking: Reported on 09/01/2020 02/17/20   Azzie Glatter, FNP  ?insulin aspart (NOVOLOG) 100 UNIT/ML injection Inject 10 Units into the skin 3 (three) times daily before meals. ?Patient not taking: Reported on 09/01/2020 02/17/20   Azzie Glatter, FNP  ?insulin detemir (LEVEMIR) 100 UNIT/ML FlexPen Inject 20 Units into the skin daily. ?Patient not taking: Reported on 09/01/2020 02/19/20   Vevelyn Francois, NP  ?metFORMIN (GLUCOPHAGE) 1000 MG tablet Take 1 tablet (1,000 mg total) by mouth 2 (two) times daily with a meal. ?Patient not taking: Reported on 09/01/2020 02/17/20   Azzie Glatter, FNP  ?ondansetron (ZOFRAN-ODT) 4 MG disintegrating tablet Take 1 tablet (4 mg total) by mouth every 6 (six) hours as needed for nausea or vomiting. ?Patient not taking: Reported on 09/01/2020 10/22/19   Yetta Flock, MD  ?sucralfate (CARAFATE) 1 g tablet TAKE 1 TABLET BY MOUTH EVERY 8 HOURS AS NEEDED. SLOWLY DISSOLVE TABLET IN 1 TABLESPOON OF DISTILLED WATER PRIOR TO INGESTING ?Patient not taking: Reported on 09/01/2020 01/25/20  Yetta Flock, MD  ?triamcinolone cream (KENALOG) 0.1 % Apply 1 application topically 2 (two) times daily.  ?Patient not taking: Reported on 09/01/2020    [provider]  ?ustekinumab (STELARA) 90 MG/ML SOSY injection Inject 1 ml (90 mg) into the skin every 8 weeks. ?Patient not taking: Reported on 09/01/2020 02/02/20   Armbruster, Carlota Raspberry, MD  ?   ? ?Allergies    ?Adalimumab   ? ?Review of Systems   ?Review of Systems  ?Constitutional:  Negative for fever.  ?Respiratory:  Negative for shortness of breath.   ?Cardiovascular:  Negative for chest  pain.  ?Musculoskeletal:  Negative for back pain.  ?Skin:  Negative for rash.  ?Neurological:  Negative for weakness and numbness.  ? ?Physical Exam ?Updated Vital Signs ?BP 129/80   Pulse 79   Temp 98.5 ?F (36.9 ?C) (Oral)   Resp 16   Ht 5' 1" (1.549 m)   Wt 90.7 kg   SpO2 98%   BMI 37.79 kg/m?  ?Physical Exam ?Vitals and nursing note reviewed.  ?Constitutional:   ?   General: She is not in acute distress. ?   Appearance: Normal appearance. She is well-developed.  ?HENT:  ?   Head: Normocephalic and atraumatic.  ?Eyes:  ?   Conjunctiva/sclera: Conjunctivae normal.  ?Cardiovascular:  ?   Rate and Rhythm: Normal rate and regular rhythm.  ?   Heart sounds: No murmur heard. ?Pulmonary:  ?   Effort: Pulmonary effort is normal. No respiratory distress.  ?   Breath sounds: Normal breath sounds.  ?Abdominal:  ?   Palpations: Abdomen is soft.  ?   Tenderness: There is no abdominal tenderness.  ?Musculoskeletal:     ?   General: Tenderness present. No swelling. Normal range of motion.  ?   Cervical back: Neck supple.  ?   Comments: She is diffusely tender around her anterior knee and posterior knee and her left calf.  No cords appreciated.  No abnormal masses appreciated.  Distal pulses motor and sensation intact.  ?Skin: ?   General: Skin is warm and dry.  ?   Capillary Refill: Capillary refill takes less than 2 seconds.  ?Neurological:  ?   General: No focal deficit present.  ?   Mental Status: She is alert.  ?   Sensory: No sensory deficit.  ?   Motor: No weakness.  ? ? ?ED Results / Procedures / Treatments   ?Labs ?(all labs ordered are listed, but only abnormal results are displayed) ?Labs Reviewed - No data to display ? ?EKG ?None ? ?Radiology ?DG Knee Complete 4 Views Left ? ?Result Date: 06/27/2021 ?CLINICAL DATA:  Knee pain EXAM: LEFT KNEE - COMPLETE 4 VIEW COMPARISON:  None. FINDINGS: No evidence of fracture, dislocation, or joint effusion. No evidence of arthropathy or other focal bone abnormality. Soft  tissues are unremarkable. IMPRESSION: Negative. Electronically Signed   By: Merilyn Baba M.D.   On: 06/27/2021 20:38   ? ?Procedures ?Ultrasound ED DVT ? ?Date/Time: 06/27/2021 9:58 PM ?Performed by: Hayden Rasmussen, MD ?Authorized by: Hayden Rasmussen, MD  ? ?Procedure details:  ?  Indications: lower extremity pain   ?  Assessment for:  DVT ?  Images Archived: No   ?  Limitations:  None ?LLE Findings:  ?  Left common femoral vein:  Compressible ?  Left deep and superficial femoral veins:  Compressible ?  Left popliteal vein:  Compressible  ? ? ?Medications Ordered in ED ?Medications -  No data to display ? ?ED Course/ Medical Decision Making/ A&P ?Clinical Course as of 06/27/21 2159  ?Tue Jun 27, 2021  ?2159 Formal vascular ultrasound not available at this time.  I did a bedside ultrasound that did not show any evidence of DVT.  We will have her come back tomorrow for formal ultrasound.  Recommended symptomatic treatment otherwise. [MB]  ?  ?Clinical Course User Index ?[MB] Hayden Rasmussen, MD  ? ?                        ?Medical Decision Making ? ?Diagnosis includes DVT, musculoskeletal pain, knee fracture, dislocation, meniscal derangement, ligamentous injury.  No signs of infection on exam.  X-rays ordered interpreted by me as no acute fracture.  Bedside ultrasound does not show any evidence of DVT.  Will need formal ultrasound for confirmation.  Patient agreeable to plan for ultrasound tomorrow.  No indications for admission or anticoagulation at this time.  Recommended symptomatic treatment ? ? ? ? ? ? ? ?Final Clinical Impression(s) / ED Diagnoses ?Final diagnoses:  ?Acute pain of left knee  ?Pain of left calf  ? ? ?Rx / DC Orders ?ED Discharge Orders   ? ? None  ? ?  ? ? ?  ?Hayden Rasmussen, MD ?06/28/21 1847 ? ?

## 2021-06-27 NOTE — Discharge Instructions (Signed)
You were seen in the emergency department for evaluation of left knee and calf pain that occurred after a prolonged plane flight.  Your x-ray of your knee did not show any obvious findings.  We did a bedside ultrasound that did not show an obvious clot but you will need a formal ultrasound to make sure that there was not something more subtle.  You can use a warm compress to your knee and calf and ibuprofen.  Please contact the vascular clinic to set up the ultrasound.  Return if any worsening or concerning symptoms ?

## 2021-06-27 NOTE — ED Triage Notes (Signed)
Left lower leg pain x several days after a long airplane flight.  ?

## 2021-06-27 NOTE — ED Provider Triage Note (Signed)
Emergency Medicine Provider Triage Evaluation Note ? ?Sarah Meadows , a 56 y.o. female  was evaluated in triage.  Pt complains of leg pain that started a few days ago. Pain located to the left anterior knee and to the left calf. She states she was on a plane for about 6 hours this weeks. Denies chest pain or sob ? ?Review of Systems  ?Positive: Left knee pain, left calf pain ?Negative: Chest pain, sob ? ?Physical Exam  ?BP 122/78 (BP Location: Right Arm)   Pulse 88   Temp 98.5 ?F (36.9 ?C) (Oral)   Resp 18   Ht 5' 1"  (1.549 m)   Wt 90.7 kg   SpO2 99%   BMI 37.79 kg/m?  ?Gen:   Awake, no distress   ?Resp:  Normal effort  ?MSK:   Moves extremities without difficulty  ?Other:  TTP to the left knee along the patella and medial joint line. Calf ttp noted as well. Dp pulse intact.  ? ?Medical Decision Making  ?Medically screening exam initiated at 7:20 PM.  Appropriate orders placed.  Sarah Meadows was informed that the remainder of the evaluation will be completed by another provider, this initial triage assessment does not replace that evaluation, and the importance of remaining in the ED until their evaluation is complete. ? ? ?  ?Rodney Booze, [place] ?06/27/21 1923 ? ?

## 2021-06-28 ENCOUNTER — Ambulatory Visit (HOSPITAL_COMMUNITY)
Admission: RE | Admit: 2021-06-28 | Discharge: 2021-06-28 | Disposition: A | Discharge status: Home / Self Care | Payer: BC Managed Care – PPO | Source: Ambulatory Visit | Attending: Emergency Medicine | Admitting: Emergency Medicine

## 2021-06-28 DIAGNOSIS — M79605 Pain in left leg: Secondary | ICD-10-CM

## 2021-06-28 NOTE — Progress Notes (Signed)
Lower extremity venous duplex has been completed.  ? ?Preliminary results in CV Proc.  ? ?Phinley Schall Marquisha Nikolov ?06/28/2021 12:04 PM    ?

## 2021-08-14 IMAGING — CR DG CHEST 2V
2 series · 2 of 2 positions shown · non-contrast
Comparison: None.

CLINICAL DATA: Chest pain. Abdominal pain with vomiting and
diarrhea at work.

EXAM:
CHEST - 2 VIEW

[w chest pa]
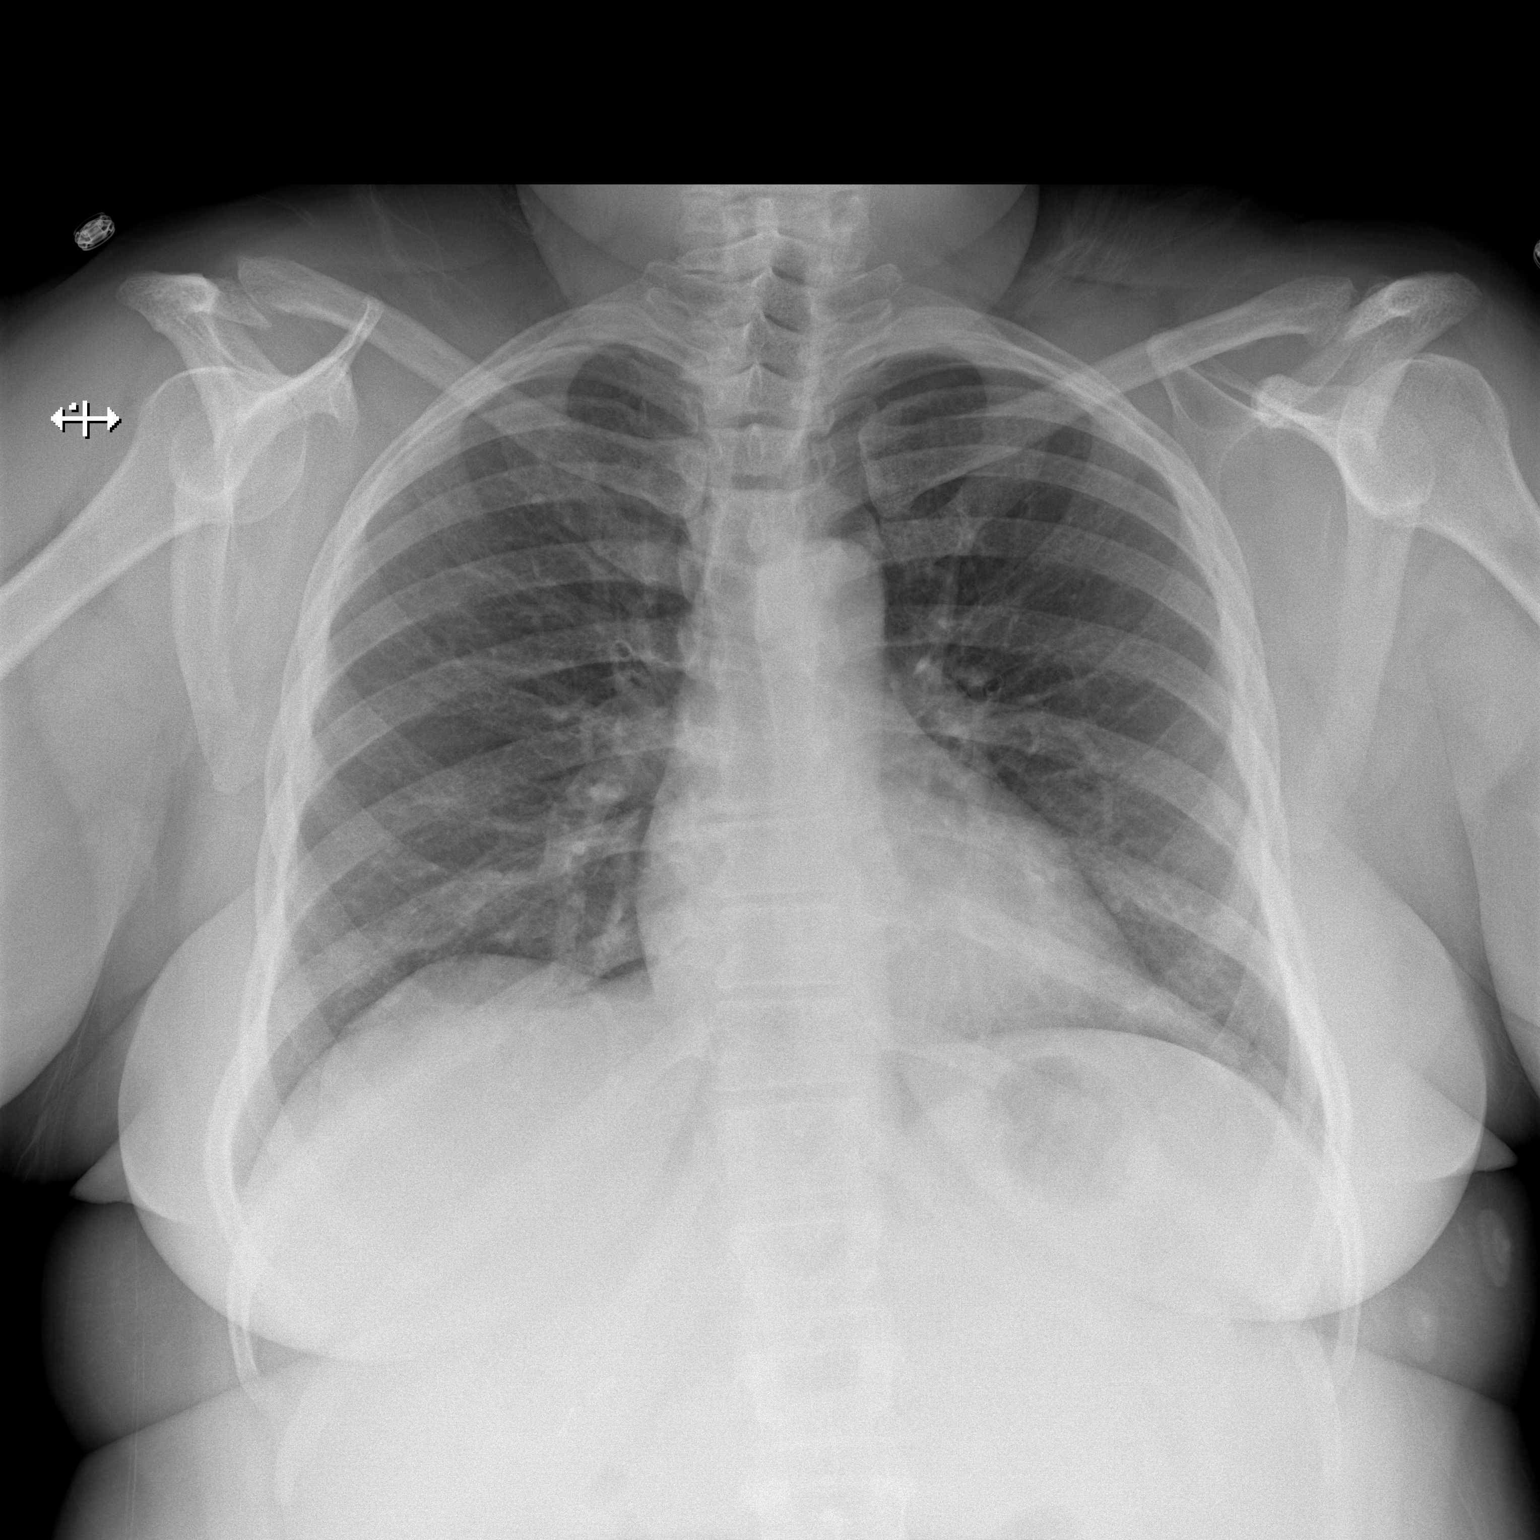

[w chest lat]
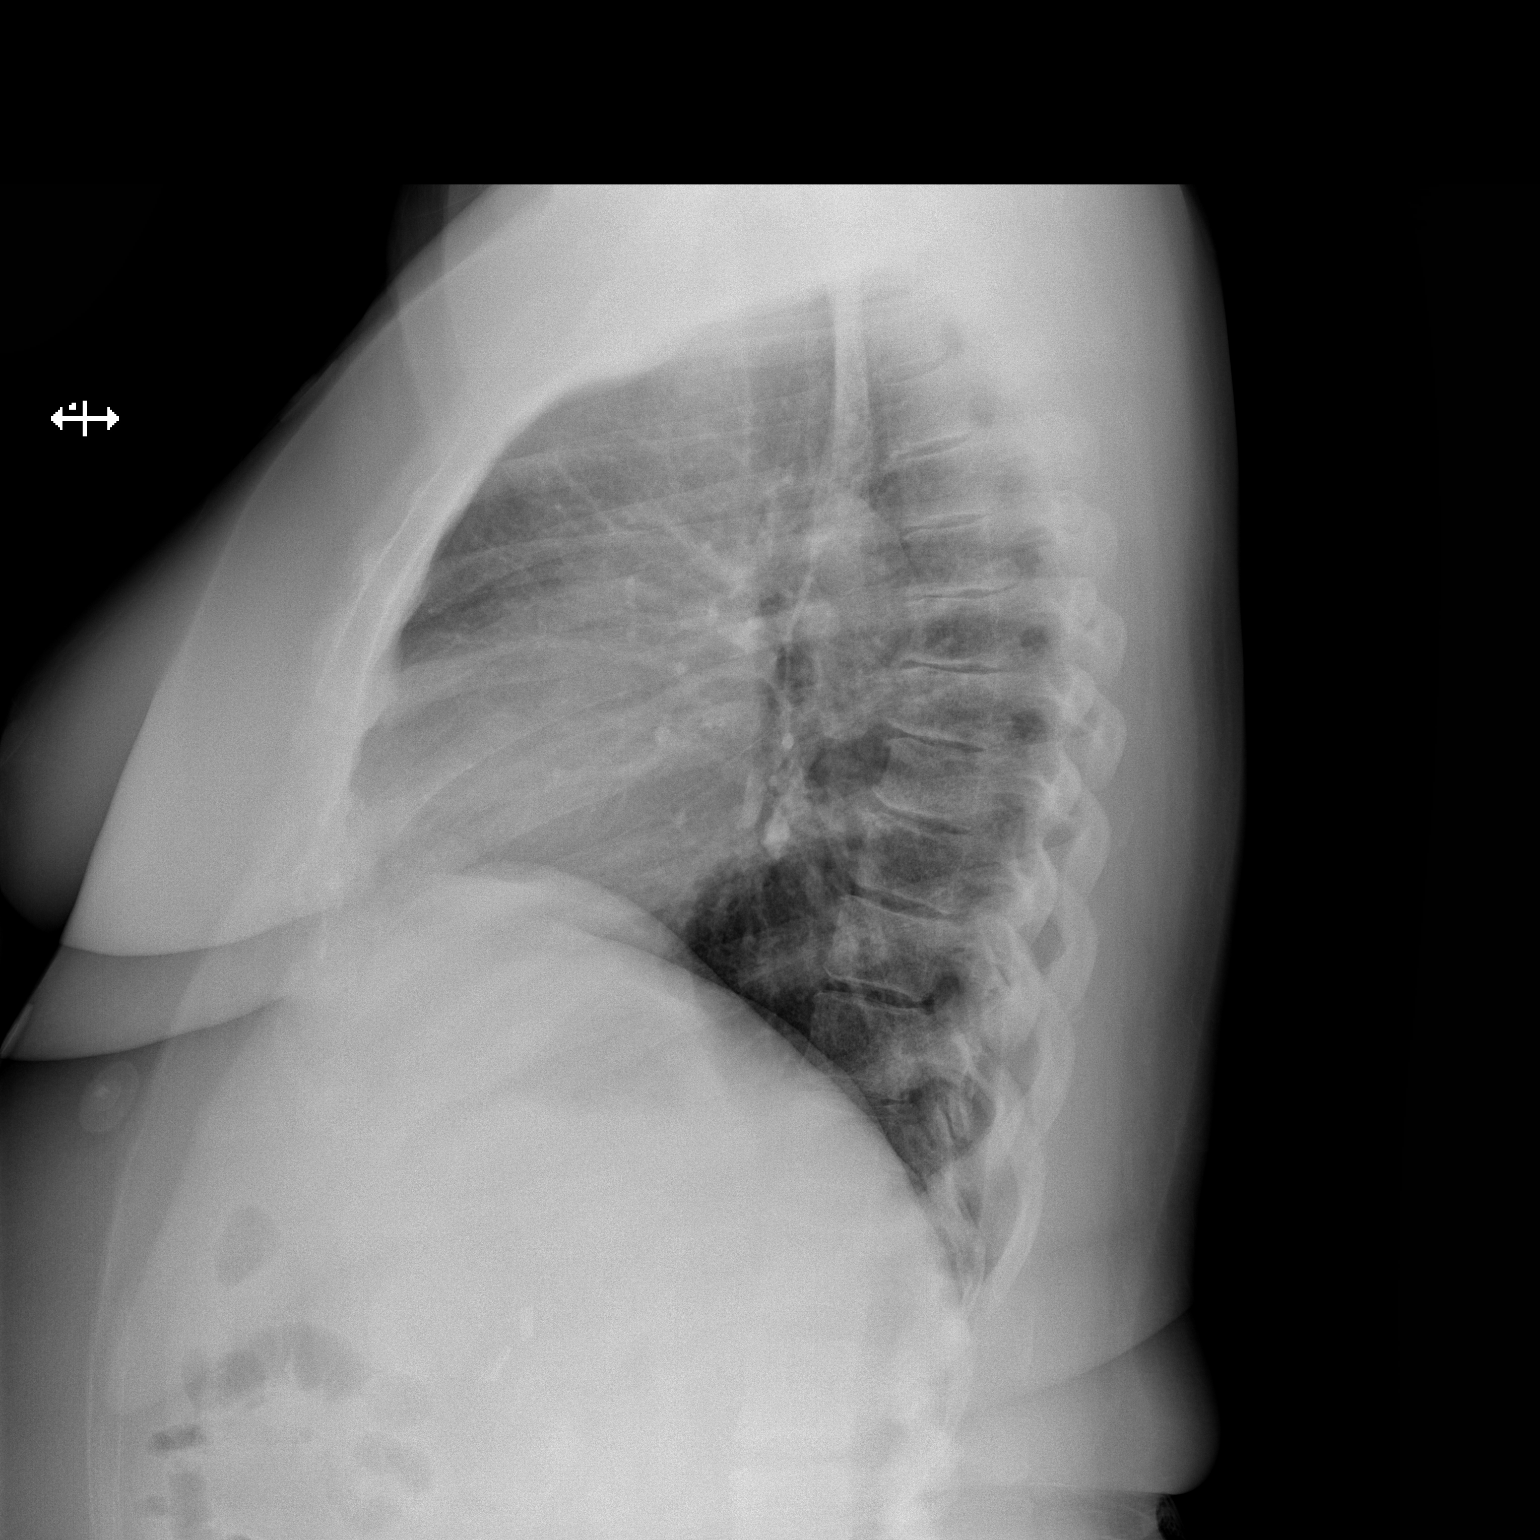

[2 of 2 positions shown; findings below may reference images not displayed]

FINDINGS: The heart size and mediastinal contours are within normal limits.
Both lungs are clear. The visualized skeletal structures are
unremarkable.
IMPRESSION: No active cardiopulmonary disease.

## 2021-12-21 ENCOUNTER — Telehealth: Payer: Self-pay | Admitting: Gastroenterology

## 2021-12-21 DIAGNOSIS — K50818 Crohn's disease of both small and large intestine with other complication: Secondary | ICD-10-CM

## 2021-12-21 MED ORDER — DICYCLOMINE HCL 10 MG PO CAPS: 10.0000 mg | ORAL_CAPSULE | Freq: Three times a day (TID) | ORAL | 2 refills | Status: DC | PRN

## 2021-12-21 MED ORDER — ONDANSETRON 4 MG PO TBDP: 4.0000 mg | ORAL_TABLET | Freq: Four times a day (QID) | ORAL | 3 refills | Status: DC | PRN

## 2021-12-21 NOTE — Telephone Encounter (Signed)
Returned call to patient. She thinks that she is having a Crohn's flare. Pt states that anything that she eats or drinks upsets her stomach. Pt reports lower abdominal cramping. Pt reports that pain has been so severe at times that she has been vomiting. Pt reports that she has about 5-6 loose stools daily, no blood in stools. Denies any fever. Pt reports that symptoms have been intermittent since July, but have worsened in the last 2 weeks. Pt reports that she stopped her Stelara on her own last year with hopes of managing Crohn's with modification to her diet. Pt states that she has lost about 52-53 lbs since October 2022. Pt is not currently on any treatment for Stelara. Patient has not been seen since 12/2019. She was scheduled for a follow up appt with Nicoletta Ba, PA-C on 01/17/22 at 2:15 pm. Please advise on recommendations until appt. Thanks

## 2021-12-21 NOTE — Telephone Encounter (Signed)
She should not have stopped Stelara without discussing with [place] first. Likely having a Crohn's flare. I'd like her to come to the lab for CBC, CMET, ESR, CRP, and stool for C Diff. She should go on a liquid or low residue diet Can give some Zofran for nausea and bentyl 37m every 8 hours PRN for pain.  If she can get labs done tomorrow I can review, may give her a course of budesonide pending labs. If severe pain, intolerant of PO, she would need to go to the hospital for further evaluation. Otherwise I can add her into my schedule on 9/14 at 1130 if she is free that day

## 2021-12-21 NOTE — Telephone Encounter (Signed)
Lm on vm for patient to return call. Lab order and C. Diff stool study order in epic. Prescriptions for Zofran and Dicyclomine sent to [facility] pharmacy on file. Patient's follow up appt has been rescheduled to Thursday, 12/28/21 at 11:30 am with Dr. Havery Moros.

## 2021-12-21 NOTE — Telephone Encounter (Signed)
Patient called to schedule an appointment as she is having difficulty with her Crohn's symptoms.  She has been scheduled with Amy Esterwood 01/17/22 at 2:15 p.m.  Is there anything she can be doing between now and her appointment?  Please call patient and advise.  Thank you.

## 2021-12-21 NOTE — Telephone Encounter (Signed)
Pt returned call. We have reviewed Dr. Doyne Keel recommendations as outlined below. Pt will try to come by for labs and pick up stool kit tomorrow. She is aware that prescriptions have been sent to her pharmacy on file. Pt has been advised of symptoms that will require ED evaluation. Pt has been advised that her appt has been rescheduled to next week. She knows that her appt information is available in MyChart. Pt verbalized understanding and had no concerns at the end of the call.

## 2021-12-22 ENCOUNTER — Other Ambulatory Visit (INDEPENDENT_AMBULATORY_CARE_PROVIDER_SITE_OTHER): Payer: BC Managed Care – PPO

## 2021-12-22 DIAGNOSIS — K50818 Crohn's disease of both small and large intestine with other complication: Secondary | ICD-10-CM | POA: Diagnosis not present

## 2021-12-22 LAB — C-REACTIVE PROTEIN: CRP: 2.6 mg/dL (ref 0.5–20.0)

## 2021-12-22 LAB — COMPREHENSIVE METABOLIC PANEL
ALT: 24 U/L (ref 0–35)
AST: 23 U/L (ref 0–37)
Albumin: 4.1 g/dL (ref 3.5–5.2)
Alkaline Phosphatase: 76 U/L (ref 39–117)
BUN: 9 mg/dL (ref 6–23)
CO2: 29 mEq/L (ref 19–32)
Calcium: 9.7 mg/dL (ref 8.4–10.5)
Chloride: 102 mEq/L (ref 96–112)
Creatinine, Ser: 0.61 mg/dL (ref 0.40–1.20)
GFR: 100.79 mL/min (ref >60.00)
Glucose, Bld: 71 mg/dL (ref 70–99)
Potassium: 3.6 mEq/L (ref 3.5–5.1)
Sodium: 137 mEq/L (ref 135–145)
Total Bilirubin: 0.6 mg/dL (ref 0.2–1.2)
Total Protein: 8.3 g/dL (ref 6.0–8.3)

## 2021-12-22 LAB — CBC WITH DIFFERENTIAL/PLATELET
Basophils Absolute: 0.1 10*3/uL (ref 0.0–0.1)
Basophils Relative: 1.2 % (ref 0.0–3.0)
Eosinophils Absolute: 0.2 10*3/uL (ref 0.0–0.7)
Eosinophils Relative: 3.7 % (ref 0.0–5.0)
HCT: 38.2 % (ref 36.0–46.0)
Hemoglobin: 12.7 g/dL (ref 12.0–15.0)
Lymphocytes Relative: 47.4 % — ABNORMAL HIGH (ref 12.0–46.0)
Lymphs Abs: 3 10*3/uL (ref 0.7–4.0)
MCHC: 33.3 g/dL (ref 30.0–36.0)
MCV: 86.7 fl (ref 78.0–100.0)
Monocytes Absolute: 0.4 10*3/uL (ref 0.1–1.0)
Monocytes Relative: 6.3 % (ref 3.0–12.0)
Neutro Abs: 2.6 10*3/uL (ref 1.4–7.7)
Neutrophils Relative %: 41.4 % — ABNORMAL LOW (ref 43.0–77.0)
Platelets: 306 10*3/uL (ref 150.0–400.0)
RBC: 4.4 Mil/uL (ref 3.87–5.11)
RDW: 15.3 % (ref 11.5–15.5)
WBC: 6.3 10*3/uL (ref 4.0–10.5)

## 2021-12-22 LAB — SEDIMENTATION RATE: Sed Rate: 49 mm/hr — ABNORMAL HIGH (ref 0–30)

## 2021-12-26 ENCOUNTER — Other Ambulatory Visit: Payer: BC Managed Care – PPO

## 2021-12-26 DIAGNOSIS — K50818 Crohn's disease of both small and large intestine with other complication: Secondary | ICD-10-CM

## 2021-12-28 ENCOUNTER — Encounter: Payer: Self-pay | Admitting: Gastroenterology

## 2021-12-28 ENCOUNTER — Ambulatory Visit: Payer: BC Managed Care – PPO | Admitting: Gastroenterology

## 2021-12-28 ENCOUNTER — Other Ambulatory Visit: Payer: BC Managed Care – PPO

## 2021-12-28 ENCOUNTER — Other Ambulatory Visit: Payer: Self-pay | Admitting: Gastroenterology

## 2021-12-28 DIAGNOSIS — K50818 Crohn's disease of both small and large intestine with other complication: Secondary | ICD-10-CM

## 2021-12-28 DIAGNOSIS — A0472 Enterocolitis due to Clostridium difficile, not specified as recurrent: Secondary | ICD-10-CM | POA: Diagnosis not present

## 2021-12-28 LAB — CLOSTRIDIUM DIFFICILE BY PCR: Toxigenic C. Difficile by PCR: POSITIVE — AB

## 2021-12-28 MED ORDER — VANCOMYCIN HCL 125 MG PO CAPS: 125.0000 mg | ORAL_CAPSULE | Freq: Four times a day (QID) | ORAL | 0 refills | Status: DC

## 2021-12-28 MED ORDER — VANCOMYCIN HCL 125 MG PO CAPS: 125.0000 mg | ORAL_CAPSULE | Freq: Four times a day (QID) | ORAL | 0 refills | Status: AC

## 2021-12-28 NOTE — Progress Notes (Signed)
HPI :  56 year old female here for follow-up of ileocolonic Crohn's disease.    Crohn's disease Dx with ileocolonic Crohn's disease in 2006.  She states symptoms were apparently mild at that time and she never had any therapy for this until 2020 or so. Symptoms worsened in 2020, she had a colonoscopy at that time which revealed multiple deep ulcers in the cecum and a deformed IC valve.  Ileum could not be intubated.  Biopsies showed chronic active colitis.  CT enterography showed some wall thickening in the ileum and questionable duodenum.  At that point time she was placed on Remicade.  During this time she is also developed bilateral sacroiliitis.  Per review of the chart there was some issue with compliance with Remicade infusions.  She apparently felt worse earlier in 2021, Remicade levels were drawn and were undetectable with an extremely high antibody level as below and it was stopped.  She was transitioned to Humira and after a few doses developed a rash on her arms for which she was referred to dermatology.  She had a biopsy which showed lichen planus and it was unclear if Humira had been related to that or not.  She then stopped Humira and was referred to [place]. Last seen 12/2019. 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 Placed on Stelera at that time - she took for 4-6 months and self discontinued, I have not seen her since then.   Since last visit   I last saw the patient in September 2021.  As above, at that time I had recommended starting her on Stelara for her active disease.  She states she completed the infusion and took some doses at home, she thinks she was on it for 4 to 6 months.  She does feel like the Stelara helped her however did not resolve her symptoms in entirety.  She states at some point she felt that given her symptoms persisted she wanted to be off all medications and try to manage it with diet and weight loss.  She has  changed her diet and lost weight, however her symptoms have persisted over time.  Her index Crohn's symptoms are mostly abdominal pain in her right lower side, that can happen after eating.  She has some cramping and occasional vomiting.  She also has had intermittent loose stools.  She states the symptoms had bothered her intermittently for a while, however this past July things got much worse.  She got COVID and then felt worse after that.  Was having significant episodes of abdominal pain with vomiting and loose stools.  Her son interestingly was recently diagnosed with Crohn's disease as well.  When she called in with worsening symptoms we performed baseline labs which showed an elevated ESR, CRP was normal, normal white blood cell count and hemoglobin.  I had recommended C. difficile testing and that had just come back this morning and was positive.  She denies any recent antibiotic use or sick contacts.  She has been on a liquid diet since I spoke with her and she states she is feeling a lot better, her pain has significantly improved.  No fevers.  The Bentyl is also helping her abdominal pain as well.  She continues to have some loose stools.  She is not using any NSAIDs.  She does not smoke cigarettes.  She is never had a surgery for her Crohn's disease before.  She is due for pneumococcal vaccine and  shingles vaccine.   Prior workup:  Labs: 05/14/19 - infliximab level undetectable, antibody level of 1632     EGD 05/25/19 - small HH, erythema of the antrum, normal duodenum - Dr. Esaw Dace - path negative for H pylori, shows mild chronic inactive gastritis   Colonoscopy 05/06/18 - multiple deep ulcers in the cecum, IC valve deformed, could not intubate the TI, diverticulosis noted, otherwise normal colon  - path shows moderate active colitis in IC valve and cecum, otherise normal biopsies throughout   CT enterography - 05/14/18 - wall thickening of the ileum and duodenum, bilateral  sacroileitis   CT scan Feb 2021 - mild fatty liver, no active Crohn's otherwise   TPMT 10 TB negative 10/22/19 Hep testing negative B12 / iron studies okay  Fecal calprotectin 97 - intermediate   Colonoscopy 12/03/19 - The perianal and digital rectal examinations were normal. - A mild stenosis was found at the IC valve but easily traversed. Patchy inflammation characterized by erosions and erythema was found in the terminal ileum. There appeared to be another area of stricturing a few cm proximal to the valve which was not traversed. - A benign-appearing, intrinsic moderate to severe stenosis was found in the cecum near the IC valve. This was able to be traversed to visualize the cecal cap, which was otherwise normal. Mild ulceration noted at the stricture. Biopsies were taken with a cold forceps for histology. - A 4 mm polyp was found in the sigmoid colon. The polyp was sessile. The polyp was removed with a cold snare. Resection and retrieval were complete. - Multiple small-mouthed diverticula were found in the left colon. - The exam was otherwise without abnormality. No other obvious inflammation noted.   1. Surgical [P], colon, cecal stricture - CHRONIC MILDLY ACTIVE COLITIS. - NO DYSPLASIA OR MALIGNANCY. 2. Surgical [P], random colon bx - BENIGN COLONIC MUCOSA. - NO ACTIVE INFLAMMATION. - NO DYSPLASIA OR MALIGNANCY. 3. Surgical [P], colon, sigmoid, polyp - HYPERPLASTIC POLYP. - NO DYSPLASIA OR MALIGNANCY.    Past Medical History:  Diagnosis Date   Crohn's disease ([place]) 2006   Crohn's disease of both small and large intestine ([place])    Diabetes ([place])    Type 2   Hyperlipidemia 44/8185   Lichen planus    Restless leg syndrome    Sacroiliac inflammation ([facility])      Past Surgical History:  Procedure Laterality Date   CESAREAN SECTION     x 2   CHOLECYSTECTOMY     COLONOSCOPY  04/2018   Family History  Problem Relation Age of Onset   Diabetes Mother    Blindness  Father    Dementia Father    Diabetes Father    Colon cancer Neg Hx    Pancreatic cancer Neg Hx    Stomach cancer Neg Hx    Colon polyps Neg Hx    Esophageal cancer Neg Hx    Rectal cancer Neg Hx    Social History   Tobacco Use   Smoking status: Never   Smokeless tobacco: Never  Vaping Use   Vaping Use: Never used  Substance Use Topics   Alcohol use: Not Currently   Drug use: Never   Current Outpatient Medications  Medication Sig Dispense Refill   atorvastatin (LIPITOR) 10 MG tablet Take 1 tablet (10 mg total) by mouth daily. 90 tablet 3   blood glucose meter kit and supplies Dispense based on patient and insurance preference. Use up to four times daily as directed. (  FOR ICD-10 E10.9, E11.9). 1 each 0   clobetasol cream (TEMOVATE) 6.38 % Apply 1 application  topically 2 (two) times daily.     dicyclomine (BENTYL) 10 MG capsule Take 1 capsule (10 mg total) by mouth every 8 (eight) hours as needed (abdominal cramping or pain). 30 capsule 2   MOUNJARO 10 MG/0.5ML Pen Once weekly (Thursday)     ondansetron (ZOFRAN-ODT) 4 MG disintegrating tablet Take 1 tablet (4 mg total) by mouth every 6 (six) hours as needed for nausea or vomiting. 30 tablet 3   sucralfate (CARAFATE) 1 g tablet TAKE 1 TABLET BY MOUTH EVERY 8 HOURS AS NEEDED. SLOWLY DISSOLVE TABLET IN 1 TABLESPOON OF DISTILLED WATER PRIOR TO INGESTING 180 tablet 1   triamcinolone cream (KENALOG) 0.1 % Apply 1 application  topically 2 (two) times daily.     ustekinumab (STELARA) 90 MG/ML SOSY injection Inject 1 ml (90 mg) into the skin every 8 weeks. (Patient not taking: Reported on 09/01/2020) 2 mL 3   No current facility-administered medications for this visit.   Allergies  Allergen Reactions   Adalimumab Rash    Other reaction(s): Unknown     Review of Systems: All systems reviewed and negative except where noted in HPI.   Lab Results  Component Value Date   WBC 6.3 12/22/2021   HGB 12.7 12/22/2021   HCT 38.2 12/22/2021    MCV 86.7 12/22/2021   PLT 306.0 12/22/2021    Lab Results  Component Value Date   CREATININE 0.61 12/22/2021   BUN 9 12/22/2021   NA 137 12/22/2021   K 3.6 12/22/2021   CL 102 12/22/2021   CO2 29 12/22/2021    Lab Results  Component Value Date   ALT 24 12/22/2021   AST 23 12/22/2021   ALKPHOS 76 12/22/2021   BILITOT 0.6 12/22/2021     Physical Exam: BP 102/78   Pulse 86   Ht 5' 1"  (1.549 m)   Wt 155 lb (70.3 kg)   BMI 29.29 kg/m  Constitutional: Pleasant,well-developed, female in no acute distress. HEENT: Normocephalic and atraumatic. Conjunctivae are normal. No scleral icterus. Neck supple.  CAbdominal: Soft, nondistended, nontender. There are no masses palpable.  Extremities: no edema Lymphadenopathy: No cervical adenopathy noted. Neurological: Alert and oriented to person place and time. Skin: Skin is warm and dry. No rashes noted. Psychiatric: Normal mood and affect. Behavior is normal.   ASSESSMENT: 56 y.o. female here for assessment of the following  1. Crohn's disease of both small and large intestine with other complication ([place])   2. Enteritis due to Clostridium difficile    Detailed history as above.  She has mostly ileal Crohn's disease with mild stricture at the IC valve, with another stricture in her cecum based off most recent colonoscopy.  Failed Remicade and Humira, placed on Stelara in 2021 which she took for a few months and then self discontinued and I have not seen her since that time.  She has continued with intermittent abdominal pain and loose stools.  Has been "flaring" since July with intermittent significant symptoms.  Labs done recently which are stable, other than elevated ESR, she also just tested positive for C. difficile.  Had a lengthy discussion with the patient about her Crohn's disease.  I suspect she has active inflammatory Crohn's but also wonder if she has developed a worsening stricture with what sounds like intermittent  obstructive symptoms.  We discussed long-term management of her Crohn's disease.  She has not seen me  in a few years, I think it is very likely she will continue to have symptoms from Crohn's disease without therapy, putting her at risk for obstructive symptoms and surgery and complications from Crohn's disease.  She understands this and wants to proceed with therapy.  My hope is that we can get her controlled with medical therapy and avoid the need for surgery, however with her symptoms I am recommending an MR enterography to assess for any significant stricture.  We will await that result initially.   Regarding medical options for her Crohn's disease, reviewed options, discussed risks / benefits of each.  She has failed anti-TNF.  Sounds like she did respond somewhat to Stelara but did not follow-up with me to hard to say what was the reason for that, if levels were subtherapeutic etc., or perhaps symptoms more so due to stricture.  She does not want to resume that.  For her Crohn's disease I recommend Skyrizi first-line at this point, or consideration for Rinvoq if she wanted an oral regimen.  We discussed what each of these in detail.  She wishes to proceed with Orson Ape if covered by her insurance.  We will send her to the lab for [place] old and viral hepatitis testing as she is due for that.  In the interim she needs to be treated for C. difficile, she does tested positive for this and possible that could be flaring her Crohn's as well.  We will give her vancomycin 125 mg 4 times daily for 10 days.  If she has persistent pain or symptoms with this can add on budesonide, will hold off for now and see how she responds to vancomycin initially as her labs otherwise look good and she is feeling better since she called in.  She is in need of the new pneumococcal vaccine, as well as shingles vaccine prior to starting therapy.  Will refer her to get those done.  She will continue Bentyl in the interim.  Again my  hope is that we can control her symptoms with medical therapy.  If she does not do well with medical therapy or has a high grade stricture on MRE, may need surgical evaluation.  She wants to avoid surgery if possible.  PLAN: - MR enterography - assess inflammatory burden, rule out high grade stricture - lab today for quantiferon gold, hepatitis B surface antigen, hep B core AB, hep C AB - hope to transition to [place] - will reach out to insurance for approval - written script for new pneumonia vaccine and shingrix vaccine, in addition to annual flu shot - continue bentyl PRN - start Vancomycin 195m QID for 10 days - consider budsonide taper in the interim if worsening  - low residual diet - ideally start medical therapy for Crohn's and do colonoscopy in the next 6-9 months. If no improvement may need surgical evaluation, or sooner surgical consult if high grade stricture  I spent 45 minutes of time, including in depth chart review,  face-to-face time with the patient, coordinating care,  and documentation.   SJolly Mango MD [facility]

## 2021-12-28 NOTE — Patient Instructions (Signed)
If you are age 55 or younger, your body mass index should be between 19-25. Your Body mass index is 29.29 kg/m. If this is out of the aformentioned range listed, please consider follow up with your Primary Care Provider.  ________________________________________________________  The [facility] providers would like to encourage you to use [facility] to communicate with providers for non-urgent requests or questions.  Due to long hold times on the telephone, sending your provider a message by [facility] may be a faster and more efficient way to get a response.  Please allow 48 business hours for a response.  Please remember that this is for non-urgent requests.  _______________________________________________________  Your provider has requested that you go to the basement level for lab work before leaving today. Press "B" on the elevator. The lab is located at the first door on the left as you exit the elevator.  You have been scheduled for an MR Enterography at [facility] on 01-08-22. Your appointment time is 3:00pm. Please arrive to admitting (at main entrance of the hospital) 30 minutes prior to your appointment time for registration purposes. Please make certain not to have anything to eat or drink 4 hours prior to your test. In addition, if you have any metal in your body, have a pacemaker or defibrillator, please be sure to let your ordering physician know. This test typically takes 45 minutes to 1 hour to complete. Should you need to reschedule, please call [phone] to do so.  Due to recent changes in healthcare laws, you may see the results of your imaging and laboratory studies on MyChart before your provider has had a chance to review them.  We understand that in some cases there may be results that are confusing or concerning to you. Not all laboratory results come back in the same time frame and the provider may be waiting for multiple results in order to interpret others.  Please give [place] 48  hours in order for your provider to thoroughly review all the results before contacting the office for clarification of your results.   [place], RN will be in contact with you regarding Skyrizi.  Please have Prevnar 20 and Shrinix vaccines at your local pharmacy.  CONTINUE: Bentyl  We have sent the following medications to your pharmacy for you to pick up at your convenience:  START: Vancomycin 125 mg one tablet 4 times daily for 10 days.   Thank you for entrusting me with your care and choosing [facility].  Dr Havery Moros

## 2021-12-28 NOTE — Progress Notes (Signed)
Orders placed for [facility] approval process at infusion center

## 2021-12-29 ENCOUNTER — Telehealth: Payer: Self-pay | Admitting: Pharmacy Technician

## 2021-12-29 LAB — HEPATITIS B SURFACE ANTIGEN: Hepatitis B Surface Ag: NONREACTIVE

## 2021-12-29 LAB — HEPATITIS B CORE ANTIBODY, TOTAL: Hep B Core Total Ab: NONREACTIVE

## 2021-12-29 LAB — HEPATITIS C ANTIBODY: Hepatitis C Ab: NONREACTIVE

## 2021-12-29 NOTE — Telephone Encounter (Signed)
Auth Submission: approved Payer: [employer] Medication & CPT/J Code(s) submitted: Skyrizi [employer]) [phone] Route of submission (phone, fax, portal): COVER MY MEDS Phone # Fax # Auth type: Buy/Bill Units/visits requested: 600MG X3 doses (1800 units) Reference number: [id number] Approval from: 12/29/21 to 03/29/22  at [place] co-pay card: Pending

## 2022-01-02 LAB — QUANTIFERON-TB GOLD PLUS
Mitogen-NIL: >10 IU/mL
NIL: 0.05 IU/mL
QuantiFERON-TB Gold Plus: NEGATIVE
TB1-NIL: 0 IU/mL
TB2-NIL: 0.01 IU/mL

## 2022-01-05 ENCOUNTER — Encounter: Payer: Self-pay | Admitting: Gastroenterology

## 2022-01-08 ENCOUNTER — Ambulatory Visit (HOSPITAL_COMMUNITY)
Admission: RE | Admit: 2022-01-08 | Discharge: 2022-01-08 | Disposition: A | Discharge status: Home / Self Care | Payer: BC Managed Care – PPO | Source: Ambulatory Visit | Attending: Gastroenterology | Admitting: Gastroenterology

## 2022-01-08 DIAGNOSIS — A0472 Enterocolitis due to Clostridium difficile, not specified as recurrent: Secondary | ICD-10-CM | POA: Diagnosis present

## 2022-01-08 DIAGNOSIS — K50818 Crohn's disease of both small and large intestine with other complication: Secondary | ICD-10-CM | POA: Insufficient documentation

## 2022-01-08 MED ORDER — BARIUM SULFATE 0.1 % PO SUSP: 450.0000 mL | Freq: Once | ORAL | Status: DC

## 2022-01-08 MED ORDER — GADOBUTROL 1 MMOL/ML IV SOLN
7.0000 mL | Freq: Once | INTRAVENOUS | Status: AC | PRN
  Administered 2022-01-08: 7 mL via INTRAVENOUS

## 2022-01-09 ENCOUNTER — Ambulatory Visit (INDEPENDENT_AMBULATORY_CARE_PROVIDER_SITE_OTHER): Payer: BC Managed Care – PPO

## 2022-01-09 VITALS — BP 104/71 | HR 81 | Temp 98.0°F | Resp 18 | Ht 61.0 in | Wt 152.4 lb

## 2022-01-09 DIAGNOSIS — K50818 Crohn's disease of both small and large intestine with other complication: Secondary | ICD-10-CM

## 2022-01-09 MED ORDER — RISANKIZUMAB-RZAA 600 MG/10ML IV SOLN
600.0000 mg | Freq: Once | INTRAVENOUS | Status: AC
  Administered 2022-01-09: 600 mg via INTRAVENOUS
  Filled 2022-01-09: qty 10

## 2022-01-09 NOTE — Progress Notes (Signed)
Diagnosis: Crohn's Disease  Provider:  Marshell Garfinkel MD  Procedure: Infusion  IV Type: Peripheral, IV Location: R Antecubital  skyrizi, Dose: 600 mg  Infusion Start Time: 3702  Infusion Stop Time: 1600  Post Infusion IV Care: Observation period completed and Peripheral IV Discontinued  Discharge: Condition: Good, Destination: Home . AVS provided to patient.   Performed by:  Adelina Mings, LPN

## 2022-01-10 NOTE — Telephone Encounter (Signed)
Dr. Havery Moros, please provide dosing of Skyrizi maintenance dose for PA process. Thanks

## 2022-01-17 ENCOUNTER — Ambulatory Visit: Payer: BC Managed Care – PPO | Admitting: Physician Assistant

## 2022-01-18 ENCOUNTER — Telehealth: Payer: Self-pay | Admitting: Pharmacy Technician

## 2022-01-18 ENCOUNTER — Other Ambulatory Visit (HOSPITAL_COMMUNITY): Payer: Self-pay

## 2022-01-18 NOTE — Telephone Encounter (Signed)
Patient Advocate Encounter  Received notification that prior authorization for [facility] is required.   PA submitted on 10.5.23 Key [id number] Status is pending    Sarah Meadows, CPhT Patient Advocate Phone: [phone]

## 2022-01-18 NOTE — Telephone Encounter (Signed)
Patient Advocate Encounter  Prior Authorization for [employer] 180MG/1.2ML cartridges has been approved.     Effective: 01-18-2022 to 01-18-2023

## 2022-01-18 NOTE — Telephone Encounter (Signed)
PA has been submitted and telephone encounter has been created

## 2022-01-19 ENCOUNTER — Other Ambulatory Visit (HOSPITAL_COMMUNITY): Payer: Self-pay

## 2022-02-06 ENCOUNTER — Ambulatory Visit (INDEPENDENT_AMBULATORY_CARE_PROVIDER_SITE_OTHER): Payer: BC Managed Care – PPO

## 2022-02-06 VITALS — BP 102/69 | HR 81 | Temp 98.7°F | Resp 18 | Ht 61.0 in | Wt 152.4 lb

## 2022-02-06 DIAGNOSIS — K50818 Crohn's disease of both small and large intestine with other complication: Secondary | ICD-10-CM | POA: Diagnosis not present

## 2022-02-06 MED ORDER — RISANKIZUMAB-RZAA 600 MG/10ML IV SOLN
600.0000 mg | Freq: Once | INTRAVENOUS | Status: AC
  Administered 2022-02-06: 600 mg via INTRAVENOUS
  Filled 2022-02-06: qty 10

## 2022-02-06 NOTE — Progress Notes (Signed)
Diagnosis: Crohn's Disease  Provider:  Marshell Garfinkel MD  Procedure: Infusion  IV Type: Peripheral, IV Location: R Antecubital  Skyrizi (risankizumab-rzaa), Dose: 600 mg  Infusion Start Time: 4739  Infusion Stop Time: 1608  Post Infusion IV Care: Peripheral IV Discontinued  Discharge: Condition: Good, Destination: Home . AVS provided to patient.   Performed by:  Adelina Mings, LPN

## 2022-02-23 ENCOUNTER — Other Ambulatory Visit (INDEPENDENT_AMBULATORY_CARE_PROVIDER_SITE_OTHER): Payer: BC Managed Care – PPO

## 2022-02-23 ENCOUNTER — Ambulatory Visit: Payer: BC Managed Care – PPO | Admitting: Gastroenterology

## 2022-02-23 ENCOUNTER — Encounter: Payer: Self-pay | Admitting: Gastroenterology

## 2022-02-23 VITALS — BP 118/82 | HR 88 | Ht 61.0 in | Wt 153.0 lb

## 2022-02-23 DIAGNOSIS — K50818 Crohn's disease of both small and large intestine with other complication: Secondary | ICD-10-CM | POA: Diagnosis not present

## 2022-02-23 DIAGNOSIS — Z79899 Other long term (current) drug therapy: Secondary | ICD-10-CM

## 2022-02-23 LAB — CBC WITH DIFFERENTIAL/PLATELET
Basophils Absolute: 0.1 10*3/uL (ref 0.0–0.1)
Basophils Relative: 0.8 % (ref 0.0–3.0)
Eosinophils Absolute: 0.2 10*3/uL (ref 0.0–0.7)
Eosinophils Relative: 2.8 % (ref 0.0–5.0)
HCT: 36.9 % (ref 36.0–46.0)
Hemoglobin: 12.3 g/dL (ref 12.0–15.0)
Lymphocytes Relative: 42.8 % (ref 12.0–46.0)
Lymphs Abs: 3.4 10*3/uL (ref 0.7–4.0)
MCHC: 33.2 g/dL (ref 30.0–36.0)
MCV: 86.9 fl (ref 78.0–100.0)
Monocytes Absolute: 0.5 10*3/uL (ref 0.1–1.0)
Monocytes Relative: 6.2 % (ref 3.0–12.0)
Neutro Abs: 3.7 10*3/uL (ref 1.4–7.7)
Neutrophils Relative %: 47.4 % (ref 43.0–77.0)
Platelets: 274 10*3/uL (ref 150.0–400.0)
RBC: 4.25 Mil/uL (ref 3.87–5.11)
RDW: 14.8 % (ref 11.5–15.5)
WBC: 7.9 10*3/uL (ref 4.0–10.5)

## 2022-02-23 LAB — COMPREHENSIVE METABOLIC PANEL
ALT: 15 U/L (ref 0–35)
AST: 17 U/L (ref 0–37)
Albumin: 4.1 g/dL (ref 3.5–5.2)
Alkaline Phosphatase: 65 U/L (ref 39–117)
BUN: 14 mg/dL (ref 6–23)
CO2: 30 mEq/L (ref 19–32)
Calcium: 9.4 mg/dL (ref 8.4–10.5)
Chloride: 106 mEq/L (ref 96–112)
Creatinine, Ser: 0.68 mg/dL (ref 0.40–1.20)
GFR: 98.06 mL/min (ref >60.00)
Glucose, Bld: 75 mg/dL (ref 70–99)
Potassium: 3.7 mEq/L (ref 3.5–5.1)
Sodium: 139 mEq/L (ref 135–145)
Total Bilirubin: 0.3 mg/dL (ref 0.2–1.2)
Total Protein: 8 g/dL (ref 6.0–8.3)

## 2022-02-23 NOTE — Patient Instructions (Addendum)
If you are age 55 or older, your body mass index should be between 23-30. Your Body mass index is 28.91 kg/m. If this is out of the aforementioned range listed, please consider follow up with your Primary Care Provider.  If you are age 67 or younger, your body mass index should be between 19-25. Your Body mass index is 28.91 kg/m. If this is out of the aformentioned range listed, please consider follow up with your Primary Care Provider.   ________________________________________________________  Please go to the lab in the basement of our building to have lab work done as you leave today. Hit "B" for basement when you get on the elevator.  When the doors open the lab is on your left.  We will call you with the results. Thank you.  Please provide documentation for your immunizations and we will update your chart.  Please follow up in 4 to 5 months.  Thank you for entrusting me with your care and for choosing [facility], Dr. Rehobeth Cellar

## 2022-02-23 NOTE — Progress Notes (Signed)
HPI :  56 year old female here for follow-up of ileocolonic Crohn's disease.    Crohn's disease Dx with ileocolonic Crohn's disease in 2006.  She states symptoms were apparently mild at that time and she never had any therapy for this until 2020 or so. Symptoms worsened in 2020, she had a colonoscopy at that time which revealed multiple deep ulcers in the cecum and a deformed IC valve.  Ileum could not be intubated.  Biopsies showed chronic active colitis.  CT enterography showed some wall thickening in the ileum and questionable duodenum.  At that point time she was placed on Remicade.  During this time she is also developed bilateral sacroiliitis.  Per review of the chart there was some issue with compliance with Remicade infusions.  She apparently felt worse earlier in 2021, Remicade levels were drawn and were undetectable with an extremely high antibody level as below and it was stopped.  She was transitioned to Humira and after a few doses developed a rash on her arms for which she was referred to dermatology.  She had a biopsy which showed lichen planus and it was unclear if Humira had been related to that or not.  She then stopped Humira and was referred to [place]. Last seen 12/2019. 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 Placed on Stelera at that time - she took for 4-6 months and self discontinued. Started [facility] Sept 2023.   Since last visit   Recall I last saw the patient in September 2023.  Recall she had been on Stelara in 2021 but self discontinued it, was on a total of 4 to 6 months.  She thought it helped her at the time but symptoms did not resolve entirely.  She tried to manage her Crohn's with diet and weight loss and her symptoms essentially persisted.  Recall her main symptoms for Crohn's disease are abdominal pain the right lower side that is intermittent, and often worse after she eats.  She is also had intermittent loose  stools.  She had C. difficile at her last visit was treated with vancomycin and she states that helped her diarrhea.  Off all therapy at her last visit, we coordinated an MR enterography with findings as below.  She had some areas of fibrostenotic change of her distal ileum as well as some inflammatory changes.  We discussed options, decided to place her on Skyrizi to see if she would benefit from treating inflammatory component and consider surgical evaluation if she had persistent symptoms.  She is now status post 2 infusions of Skyrizi, her third is scheduled for November 21.  She states it is working quite well for her and she is completely asymptomatic at this point.  She has had resolution of her abdominal pain.  She states she is eating what she wants and denies any pains.  Her diarrhea has resolved, her bowel habits are normal.  She denies any blood in her stools.  Overall she is doing really well without any complaints today.  Recall she was due for some vaccinations at her last visit.  I referred her for pneumonia vaccine, shingles vaccine, and a flu shot.  She has received all of these and is due for a follow-up shingles booster in the upcoming months.  She is not using any NSAIDs.  She does not smoke cigarettes.  She is never had a surgery for her Crohn's disease before.   Prior workup:  Labs: 05/14/19 -  infliximab level undetectable, antibody level of 1632   EGD 05/25/19 - small HH, erythema of the antrum, normal duodenum - Dr. Esaw Dace - path negative for H pylori, shows mild chronic inactive gastritis   Colonoscopy 05/06/18 - multiple deep ulcers in the cecum, IC valve deformed, could not intubate the TI, diverticulosis noted, otherwise normal colon  - path shows moderate active colitis in IC valve and cecum, otherise normal biopsies throughout   CT enterography - 05/14/18 - wall thickening of the ileum and duodenum, bilateral sacroileitis   CT scan Feb 2021 - mild fatty liver, no  active Crohn's otherwise   TPMT 10 TB negative 10/22/19 Hep testing negative B12 / iron studies okay   Fecal calprotectin 97 - intermediate   Colonoscopy 12/03/19 - The perianal and digital rectal examinations were normal. - A mild stenosis was found at the IC valve but easily traversed. Patchy inflammation characterized by erosions and erythema was found in the terminal ileum. There appeared to be another area of stricturing a few cm proximal to the valve which was not traversed. - A benign-appearing, intrinsic moderate to severe stenosis was found in the cecum near the IC valve. This was able to be traversed to visualize the cecal cap, which was otherwise normal. Mild ulceration noted at the stricture. Biopsies were taken with a cold forceps for histology. - A 4 mm polyp was found in the sigmoid colon. The polyp was sessile. The polyp was removed with a cold snare. Resection and retrieval were complete. - Multiple small-mouthed diverticula were found in the left colon. - The exam was otherwise without abnormality. No other obvious inflammation noted.   1. Surgical [P], colon, cecal stricture - CHRONIC MILDLY ACTIVE COLITIS. - NO DYSPLASIA OR MALIGNANCY. 2. Surgical [P], random colon bx - BENIGN COLONIC MUCOSA. - NO ACTIVE INFLAMMATION. - NO DYSPLASIA OR MALIGNANCY. 3. Surgical [P], colon, sigmoid, polyp - HYPERPLASTIC POLYP. - NO DYSPLASIA OR MALIGNANCY.   MRE 01/09/22: IMPRESSION: 1. Imaging findings compatible with fibrostenotic disease of the distal/terminal ileum. 2. No convincing evidence of additional sites of bowel disease involvement. 3. Large volume of formed stool throughout the colon. 4. Left-sided colonic diverticulosis without findings of acute diverticulitis.     Past Medical History:  Diagnosis Date   Crohn's disease ([place]) 2006   Crohn's disease of both small and large intestine ([place])    Diabetes ([place])    Type 2   Hyperlipidemia 99/8338   Lichen  planus    Restless leg syndrome    Sacroiliac inflammation ([facility])      Past Surgical History:  Procedure Laterality Date   CESAREAN SECTION     x 2   CHOLECYSTECTOMY     COLONOSCOPY  04/2018   Family History  Problem Relation Age of Onset   Diabetes Mother    Blindness Father    Dementia Father    Diabetes Father    Colon cancer Neg Hx    Pancreatic cancer Neg Hx    Stomach cancer Neg Hx    Colon polyps Neg Hx    Esophageal cancer Neg Hx    Rectal cancer Neg Hx    Social History   Tobacco Use   Smoking status: Never   Smokeless tobacco: Never  Vaping Use   Vaping Use: Never used  Substance Use Topics   Alcohol use: Not Currently   Drug use: Never   Current Outpatient Medications  Medication Sig Dispense Refill   atorvastatin (LIPITOR) 10 MG tablet  Take 1 tablet (10 mg total) by mouth daily. 90 tablet 3   blood glucose meter kit and supplies Dispense based on patient and insurance preference. Use up to four times daily as directed. (FOR ICD-10 E10.9, E11.9). 1 each 0   clobetasol cream (TEMOVATE) 4.01 % Apply 1 application  topically 2 (two) times daily.     dicyclomine (BENTYL) 10 MG capsule Take 1 capsule (10 mg total) by mouth every 8 (eight) hours as needed (abdominal cramping or pain). 30 capsule 2   MOUNJARO 10 MG/0.5ML Pen Once weekly (Thursday)     ondansetron (ZOFRAN-ODT) 4 MG disintegrating tablet Take 1 tablet (4 mg total) by mouth every 6 (six) hours as needed for nausea or vomiting. 30 tablet 3   triamcinolone cream (KENALOG) 0.1 % Apply 1 application  topically 2 (two) times daily.     sucralfate (CARAFATE) 1 g tablet TAKE 1 TABLET BY MOUTH EVERY 8 HOURS AS NEEDED. SLOWLY DISSOLVE TABLET IN 1 TABLESPOON OF DISTILLED WATER PRIOR TO INGESTING (Patient not taking: Reported on 02/23/2022) 180 tablet 1   ustekinumab (STELARA) 90 MG/ML SOSY injection Inject 1 ml (90 mg) into the skin every 8 weeks. (Patient not taking: Reported on 09/01/2020) 2 mL 3   No current  facility-administered medications for this visit.   Allergies  Allergen Reactions   Adalimumab Rash    Other reaction(s): Unknown     Review of Systems: All systems reviewed and negative except where noted in HPI.   Lab Results  Component Value Date   WBC 6.3 12/22/2021   HGB 12.7 12/22/2021   HCT 38.2 12/22/2021   MCV 86.7 12/22/2021   PLT 306.0 12/22/2021    Lab Results  Component Value Date   CREATININE 0.61 12/22/2021   BUN 9 12/22/2021   NA 137 12/22/2021   K 3.6 12/22/2021   CL 102 12/22/2021   CO2 29 12/22/2021    Lab Results  Component Value Date   ALT 24 12/22/2021   AST 23 12/22/2021   ALKPHOS 76 12/22/2021   BILITOT 0.6 12/22/2021     Physical Exam: BP 118/82   Pulse 88   Ht _0  (1.549 m)   Wt 153 lb (69.4 kg)   BMI 28.91 kg/m  Constitutional: Pleasant,well-developed, female in no acute distress. Neurological: Alert and oriented to person place and time. Skin: Skin is warm and dry. No rashes noted. Psychiatric: Normal mood and affect. Behavior is normal.   ASSESSMENT: 56 y.o. female here for assessment of the following  1. Crohn's disease of both small and large intestine with other complication ([place])   2. High risk medication use    As above, patient has failed multiple regimens to include Humira, Remicade, and then most recently Stelara (self discontinued not necessarily failed).  Was off therapy for a few years and came in with significant symptoms in September.  She was treated for C. difficile which helps her diarrhea.  Ultimately transitioned to [facility] after discussion of options.  She has had 2 doses of this so far and doing really well.  Clinically looks quite good today and feels well with resolution of her index Crohn's symptoms.  We will plan on continuing Skyrizi at this point time.  She is due for her next infusion at the end of this month and then will transition to injection therapy at home.  Skyrizi ambassador should be teaching  her how to do this.  We have discussed risks of the medication and she  understands.  She has had flu shot, pneumococcal vaccine, and shingles vaccine since have last seen her.  She will follow-up for her Shingrix booster in upcoming months.  I will send her to the lab today for CBC and CMET to make sure stable.  I would like to see her back in the office in 4 to 5 months, or sooner if any flares or concerning symptoms arise in the interim.  We will likely plan on a colonoscopy at her next visit to reassess disease activity after being on Skyrizi for several months, assess severity of stenosis etc. in the ileum.  She agrees with the plan, all questions answered.  PLAN: - continue Orson Ape - one more infusion 11/21 and then transition to injections at home - lab today for CBC and CMET - vaccines are all UTD, follow up for Shingrix booster per her schedule - f/u 4-5 months - colonoscopy to be scheduled at next visit  I spent 30 minutes of time, including in depth chart review, face-to-face time with the patient, coordinating care, and documentation of this encounter.   Jolly Mango, MD [facility]

## 2022-02-27 ENCOUNTER — Other Ambulatory Visit (HOSPITAL_COMMUNITY): Payer: Self-pay

## 2022-03-06 ENCOUNTER — Ambulatory Visit (INDEPENDENT_AMBULATORY_CARE_PROVIDER_SITE_OTHER): Payer: BC Managed Care – PPO | Admitting: *Deleted

## 2022-03-06 VITALS — BP 118/77 | HR 100 | Temp 99.8°F | Resp 18 | Ht 61.0 in | Wt 150.6 lb

## 2022-03-06 DIAGNOSIS — K50818 Crohn's disease of both small and large intestine with other complication: Secondary | ICD-10-CM

## 2022-03-06 MED ORDER — RISANKIZUMAB-RZAA 600 MG/10ML IV SOLN
600.0000 mg | Freq: Once | INTRAVENOUS | Status: AC
  Administered 2022-03-06: 600 mg via INTRAVENOUS
  Filled 2022-03-06: qty 10

## 2022-03-06 NOTE — Progress Notes (Signed)
Diagnosis: Crohn's Disease  Provider:  Marshell Garfinkel MD  Procedure: Infusion  IV Type: Peripheral, IV Location: R Antecubital  Skyrizi (risankizumab-rzaa), Dose: 600 mg  Infusion Start Time: 3295 pm  Infusion Stop Time: 1559 pm  Post Infusion IV Care: Observation period completed and Peripheral IV Discontinued  Discharge: Condition: Good, Destination: Home . AVS provided to patient.   Performed by:  Oren Beckmann, RN

## 2022-03-30 ENCOUNTER — Telehealth: Payer: Self-pay | Admitting: Gastroenterology

## 2022-03-30 NOTE — Telephone Encounter (Signed)
Inbound call from patient stating she spoke with the infusion center and they informed her that she will start taking infusions at home and they also informed her that her gi provider would be f/u with her? Please advise.  Thank you

## 2022-04-02 MED ORDER — SKYRIZI 180 MG/1.2ML ~~LOC~~ SOCT: SUBCUTANEOUS | 5 refills | Status: DC

## 2022-04-02 NOTE — Telephone Encounter (Signed)
Skyrizi maintenance dose was approved on 01/18/22 but I was not notified. I [facility] in [facility]. I gave verbal order to [facility], pharmacist. They will need patient to contact them for overnight prescription.   Lm on vm for patient to return call to discuss Skyrizi maintenance dose. I told pt that I will also send her a MyChart message with information.   I also enrolled patient in [place] complete and she should be contact by a Careers information officer within 1 business day. Skyrizi complete form has been completed and faxed to [phone].

## 2022-04-03 NOTE — Telephone Encounter (Signed)
Pt returned call. I informed her that we contacted her pharmacy yesterday and provided a verbal order for her Skyrizi maintenance dose. Pt states that she has not heard from the pharmacy. I informed pt that I sent their contact information to MyChart, she should contact them ASAP so that they can get medication shipped to her this week since she is due for maintenance dose. Pt is aware that we have enrolled her in [place] complete and she will be assigned a Careers information officer who will assist in injection training. Pt verbalized understanding and had no concerns at the end of the call.

## 2022-05-31 ENCOUNTER — Encounter: Payer: Self-pay | Admitting: Gastroenterology

## 2022-06-07 ENCOUNTER — Telehealth: Payer: BC Managed Care – PPO | Admitting: Physician Assistant

## 2022-06-07 DIAGNOSIS — R519 Headache, unspecified: Secondary | ICD-10-CM

## 2022-06-07 NOTE — Progress Notes (Signed)
Virtual Visit Consent   Sarah Meadows, you are scheduled for a virtual visit with a [facility] provider today. Just as with appointments in the office, your consent must be obtained to participate. Your consent will be active for this visit and any virtual visit you may have with one of our providers in the next 365 days. If you have a MyChart account, a copy of this consent can be sent to you electronically.  As this is a virtual visit, video technology does not allow for your provider to perform a traditional examination. This may limit your provider's ability to fully assess your condition. If your provider identifies any concerns that need to be evaluated in person or the need to arrange testing (such as labs, EKG, etc.), we will make arrangements to do so. Although advances in technology are sophisticated, we cannot ensure that it will always work on either your end or our end. If the connection with a video visit is poor, the visit may have to be switched to a telephone visit. With either a video or telephone visit, we are not always able to ensure that we have a secure connection.  By engaging in this virtual visit, you consent to the provision of healthcare and authorize for your insurance to be billed (if applicable) for the services provided during this visit. Depending on your insurance coverage, you may receive a charge related to this service.  I need to obtain your verbal consent now. Are you willing to proceed with your visit today? Sarah Meadows has provided verbal consent on 06/07/2022 for a virtual visit (video or telephone). Sarah Meadows  Date: 06/07/2022 9:21 AM  Virtual Visit via Video Note   I, Sarah Meadows, connected with  Sarah Meadows  ([id number], [birth year]) on 06/07/22 at  9:15 AM EST by a video-enabled telemedicine application and verified that I am speaking with the correct person using two identifiers.  Location: Patient: Virtual Visit Location  Patient: Home Provider: Virtual Visit Location Provider: Home Office   I discussed the limitations of evaluation and management by telemedicine and the availability of in person appointments. The patient expressed understanding and agreed to proceed.    History of Present Illness: Sarah Meadows is a 57 y.o. who identifies as a female who was assigned female at birth, and is being seen today for headaches since Saturday -- described as frontal and aching. Was intermittent but now more constant. Some associated nausea with one episode of non-bloody emesis. Notes some occasional feeling off balance with sudden vision change. Some occasional vision changes. Denies fever, nasal or head congestion. Some chills. Notes history of migraines but this is more severe than her typical migraines. Had an old Zofran that she took for nausea.  PCP is out of office.   HPI: HPI  Problems:  Patient Active Problem List   Diagnosis Date Noted   Bilateral sacroiliitis ([place]) 10/07/2018   Controlled substance agreement signed 08/01/2018   Restless leg 04/01/2018   History of diverticulitis 03/28/2018   Obesity (BMI 30.0-34.9) 03/28/2018   Crohn's disease ([place]) 04/11/2017    Allergies:  Allergies  Allergen Reactions   Adalimumab Rash    Other reaction(s): Unknown   Medications:  Current Outpatient Medications:    atorvastatin (LIPITOR) 10 MG tablet, Take 1 tablet (10 mg total) by mouth daily., Disp: 90 tablet, Rfl: 3   blood glucose meter kit and supplies, Dispense based on patient and insurance preference. Use up to four times daily as  directed. (FOR ICD-10 E10.9, E11.9)., Disp: 1 each, Rfl: 0   clobetasol cream (TEMOVATE) AB-[id number] %, Apply 1 application  topically 2 (two) times daily., Disp: , Rfl:    dicyclomine (BENTYL) 10 MG capsule, Take 1 capsule (10 mg total) by mouth every 8 (eight) hours as needed (abdominal cramping or pain)., Disp: 30 capsule, Rfl: 2   MOUNJARO 10 MG/0.5ML Pen, Once weekly (Thursday),  Disp: , Rfl:    ondansetron (ZOFRAN-ODT) 4 MG disintegrating tablet, Take 1 tablet (4 mg total) by mouth every 6 (six) hours as needed for nausea or vomiting., Disp: 30 tablet, Rfl: 3   Risankizumab-rzaa (SKYRIZI) 180 MG/1.2ML SOCT, Inject 1 pen at week 12 (04/03/22), and then 1 pen every 8 weeks thereafter, Disp: 1.2 mL, Rfl: 5   sucralfate (CARAFATE) 1 g tablet, TAKE 1 TABLET BY MOUTH EVERY 8 HOURS AS NEEDED. SLOWLY DISSOLVE TABLET IN 1 TABLESPOON OF DISTILLED WATER PRIOR TO INGESTING (Patient not taking: Reported on 02/23/2022), Disp: 180 tablet, Rfl: 1   triamcinolone cream (KENALOG) 0.1 %, Apply 1 application  topically 2 (two) times daily., Disp: , Rfl:   Observations/Objective: Patient is well-developed, well-nourished in no acute distress.  Resting comfortably at home.  Head is normocephalic, atraumatic.  No labored breathing. Speech is clear and coherent with logical content.  Patient is alert and oriented at baseline.   Assessment and Plan: 1. Acute nonintractable headache, unspecified headache type  Giving persistent daily headache since Saturday with some more concerning symptoms -- vision change on occasion, feeling off-balance, she needs an in-person evaluation ASAP. Do not feel emergent giving how long this has been the case, and likely migrainous, but she needs examination and further evaluation than can be done via video.   Follow Up Instructions: I discussed the assessment and treatment plan with the patient. The patient was provided an opportunity to ask questions and all were answered. The patient agreed with the plan and demonstrated an understanding of the instructions.  A copy of instructions were sent to the patient via MyChart unless otherwise noted below.   The patient was advised to call back or seek an in-person evaluation if the symptoms worsen or if the condition fails to improve as anticipated.  Time:  I spent 10 minutes with the patient via telehealth  technology discussing the above problems/concerns.    Sarah Rio, PA-C

## 2022-06-07 NOTE — Patient Instructions (Signed)
  Rise Paganini, thank you for joining Leeanne Rio, PA-C for today's virtual visit.  While this provider is not your primary care provider (PCP), if your PCP is located in our provider database this encounter information will be shared with them immediately following your visit.   [facility] account gives you access to today's visit and all your visits, tests, and labs performed at [facility] " click here if you don't have a [facility] account or go to [email or web address]  Consent: (Patient) Sarah Meadows provided verbal consent for this virtual visit at the beginning of the encounter.  Current Medications:  Current Outpatient Medications:    atorvastatin (LIPITOR) 10 MG tablet, Take 1 tablet (10 mg total) by mouth daily., Disp: 90 tablet, Rfl: 3   blood glucose meter kit and supplies, Dispense based on patient and insurance preference. Use up to four times daily as directed. (FOR ICD-10 E10.9, E11.9)., Disp: 1 each, Rfl: 0   clobetasol cream (TEMOVATE) [id number] %, Apply 1 application  topically 2 (two) times daily., Disp: , Rfl:    dicyclomine (BENTYL) 10 MG capsule, Take 1 capsule (10 mg total) by mouth every 8 (eight) hours as needed (abdominal cramping or pain)., Disp: 30 capsule, Rfl: 2   MOUNJARO 10 MG/0.5ML Pen, Once weekly (Thursday), Disp: , Rfl:    ondansetron (ZOFRAN-ODT) 4 MG disintegrating tablet, Take 1 tablet (4 mg total) by mouth every 6 (six) hours as needed for nausea or vomiting., Disp: 30 tablet, Rfl: 3   Risankizumab-rzaa (SKYRIZI) 180 MG/1.2ML SOCT, Inject 1 pen at week 12 (04/03/22), and then 1 pen every 8 weeks thereafter, Disp: 1.2 mL, Rfl: 5   sucralfate (CARAFATE) 1 g tablet, TAKE 1 TABLET BY MOUTH EVERY 8 HOURS AS NEEDED. SLOWLY DISSOLVE TABLET IN 1 TABLESPOON OF DISTILLED WATER PRIOR TO INGESTING (Patient not taking: Reported on 02/23/2022), Disp: 180 tablet, Rfl: 1   triamcinolone cream (KENALOG) 0.1 %, Apply 1 application   topically 2 (two) times daily., Disp: , Rfl:    Medications ordered in this encounter:  No orders of the defined types were placed in this encounter.    *If you need refills on other medications prior to your next appointment, please contact your pharmacy*  Follow-Up: Call back or seek an in-person evaluation if the symptoms worsen or if the condition fails to improve as anticipated.  [employer] [phone]  Other Instructions Use link below to be evaluated in person today. If anything acutely worsens, you need an ER evaluation. DO NOT DELAY CARE!   If you have been instructed to have an in-person evaluation today at a local Urgent Care facility, please use the link below. It will take you to a list of all of our available [facility], including address, phone number and hours of operation. Please do not delay care.  [facility]  If you or a family member do not have a primary care provider, use the link below to schedule a visit and establish care. When you choose a [facility] primary care physician or advanced practice provider, you gain a long-term partner in health. Find a Primary Care Provider  Learn more about [facility] in-office and virtual care options: [facility]

## 2022-07-27 ENCOUNTER — Telehealth: Payer: Self-pay

## 2022-07-27 ENCOUNTER — Encounter: Payer: Self-pay | Admitting: Gastroenterology

## 2022-07-27 MED ORDER — SKYRIZI 360 MG/2.4ML ~~LOC~~ SOCT: 1.0000 | SUBCUTANEOUS | 6 refills | Status: DC

## 2022-07-27 NOTE — Telephone Encounter (Signed)
[place], will you please check to see if Skyrizi 360 MG OBI requires a PA, please.

## 2022-08-01 NOTE — Telephone Encounter (Signed)
Received [facility] from [facility] requesting clarification on Skyrizi 360 mg. They wanted to ensure that this change was intentional, I confirmed that this is a new order and it is correct.

## 2022-08-01 NOTE — Telephone Encounter (Signed)
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

## 2022-08-03 ENCOUNTER — Other Ambulatory Visit (HOSPITAL_COMMUNITY): Payer: Self-pay

## 2022-08-03 NOTE — Telephone Encounter (Signed)
Questions have not populated to complete PA for  syringe. Will continue to update or call insurance to see if it can be completed over the phone.

## 2022-10-13 DIAGNOSIS — F411 Generalized anxiety disorder: Secondary | ICD-10-CM | POA: Diagnosis present

## 2022-10-13 DIAGNOSIS — E785 Hyperlipidemia, unspecified: Secondary | ICD-10-CM | POA: Insufficient documentation

## 2022-10-13 DIAGNOSIS — E119 Type 2 diabetes mellitus without complications: Secondary | ICD-10-CM

## 2022-10-30 ENCOUNTER — Other Ambulatory Visit (INDEPENDENT_AMBULATORY_CARE_PROVIDER_SITE_OTHER): Payer: BC Managed Care – PPO

## 2022-10-30 ENCOUNTER — Ambulatory Visit: Payer: BC Managed Care – PPO | Admitting: Gastroenterology

## 2022-10-30 ENCOUNTER — Encounter: Payer: Self-pay | Admitting: Gastroenterology

## 2022-10-30 VITALS — BP 112/70 | HR 80 | Ht 61.0 in | Wt 154.0 lb

## 2022-10-30 DIAGNOSIS — Z79899 Other long term (current) drug therapy: Secondary | ICD-10-CM

## 2022-10-30 DIAGNOSIS — K50818 Crohn's disease of both small and large intestine with other complication: Secondary | ICD-10-CM | POA: Diagnosis not present

## 2022-10-30 LAB — COMPREHENSIVE METABOLIC PANEL
ALT: 17 U/L (ref 0–35)
AST: 20 U/L (ref 0–37)
Albumin: 4.2 g/dL (ref 3.5–5.2)
Alkaline Phosphatase: 65 U/L (ref 39–117)
BUN: 13 mg/dL (ref 6–23)
CO2: 28 mEq/L (ref 19–32)
Calcium: 10 mg/dL (ref 8.4–10.5)
Chloride: 105 mEq/L (ref 96–112)
Creatinine, Ser: 0.75 mg/dL (ref 0.40–1.20)
GFR: 89.21 mL/min (ref >60.00)
Glucose, Bld: 74 mg/dL (ref 70–99)
Potassium: 4.1 mEq/L (ref 3.5–5.1)
Sodium: 139 mEq/L (ref 135–145)
Total Bilirubin: 0.5 mg/dL (ref 0.2–1.2)
Total Protein: 8.1 g/dL (ref 6.0–8.3)

## 2022-10-30 LAB — CBC WITH DIFFERENTIAL/PLATELET
Basophils Absolute: 0.1 10*3/uL (ref 0.0–0.1)
Basophils Relative: 1.2 % (ref 0.0–3.0)
Eosinophils Absolute: 0.2 10*3/uL (ref 0.0–0.7)
Eosinophils Relative: 3.4 % (ref 0.0–5.0)
HCT: 39.1 % (ref 36.0–46.0)
Hemoglobin: 12.9 g/dL (ref 12.0–15.0)
Lymphocytes Relative: 48.7 % — ABNORMAL HIGH (ref 12.0–46.0)
Lymphs Abs: 3 10*3/uL (ref 0.7–4.0)
MCHC: 33.1 g/dL (ref 30.0–36.0)
MCV: 90.2 fl (ref 78.0–100.0)
Monocytes Absolute: 0.3 10*3/uL (ref 0.1–1.0)
Monocytes Relative: 5.1 % (ref 3.0–12.0)
Neutro Abs: 2.5 10*3/uL (ref 1.4–7.7)
Neutrophils Relative %: 41.6 % — ABNORMAL LOW (ref 43.0–77.0)
Platelets: 279 10*3/uL (ref 150.0–400.0)
RBC: 4.33 Mil/uL (ref 3.87–5.11)
RDW: 13.7 % (ref 11.5–15.5)
WBC: 6.1 10*3/uL (ref 4.0–10.5)

## 2022-10-30 LAB — SEDIMENTATION RATE: Sed Rate: 18 mm/hr (ref 0–30)

## 2022-10-30 LAB — HIGH SENSITIVITY CRP: CRP, High Sensitivity: 2.99 mg/L (ref 0.000–5.000)

## 2022-10-30 MED ORDER — PLENVU 140 G PO SOLR: 1.0000 | ORAL | 0 refills | Status: DC

## 2022-10-30 NOTE — Progress Notes (Signed)
HPI :  57 year old female here for follow-up of ileocolonic Crohn's disease.    Crohn's disease Dx with ileocolonic Crohn's disease in 2006.  She states symptoms were apparently mild at that time and she never had any therapy for this until 2020 or so. Symptoms worsened in 2020, she had a colonoscopy at that time which revealed multiple deep ulcers in the cecum and a deformed IC valve.  Ileum could not be intubated.  Biopsies showed chronic active colitis.  CT enterography showed some wall thickening in the ileum and questionable duodenum.  At that point time she was placed on Remicade.  During this time she is also developed bilateral sacroiliitis.  Per review of the chart there was some issue with compliance with Remicade infusions.  She apparently felt worse earlier in 2021, Remicade levels were drawn and were undetectable with an extremely high antibody level as below and it was stopped.  She was transitioned to Humira and after a few doses developed a rash on her arms for which she was referred to dermatology.  She had a biopsy which showed lichen planus and it was unclear if Humira had been related to that or not.  She then stopped Humira and was referred to [place]. Last seen 12/2019. 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 Placed on Stelera at that time - she took for 4-6 months and self discontinued. Started [facility] Sept 2023.   Since last visit   Patient here for follow-up visit for Crohn's disease.  She has been on [place] since September of last year.  She has escalated to 360 mg dosing and appears to tolerate that okay.  When I previously saw her in November she had felt better after starting it however she states in general today, she feels no better since when she started the regimen and in that in general she is not sure how much benefit it is providing at this point.  Her main symptoms of Crohn's disease have historically been  abdominal pain, postprandial in nature, as well as occasional loose stools.  Recall she does have a history of C. difficile.  Her last objective evaluation for her disease was with an MR enterography in September prior to going on Skyrizi.  She does have fibrostenotic disease on that exam, we tried to treat her inflammatory component to see if this would help her.  She does endorse having ongoing intermittent abdominal pain.  Usually in the lower to right lower quadrant abdomen that occurs after eating.  No nausea or vomiting.  She tries to avoid foods like beef which can make symptoms worse.  She does have occasional postprandial loose stools as well.  She feels that her Crohn's disease is active.  Recall her sister and son both have Crohn's disease, her son has had surgery for this.  She has not required surgery in the past but discussed this option today.   Recall she was due for some vaccinations at her last visit.  I referred her for pneumonia vaccine, shingles vaccine, and a flu shot.  She has received all of these and states she completed the shingles series   Prior workup:  Labs: 05/14/19 - infliximab level undetectable, antibody level of 1632   EGD 05/25/19 - small HH, erythema of the antrum, normal duodenum - Dr. Rinaldo Ratel - path negative for H pylori, shows mild chronic inactive gastritis   Colonoscopy 05/06/18 - multiple deep ulcers in the cecum, IC  valve deformed, could not intubate the TI, diverticulosis noted, otherwise normal colon  - path shows moderate active colitis in IC valve and cecum, otherise normal biopsies throughout   CT enterography - 05/14/18 - wall thickening of the ileum and duodenum, bilateral sacroileitis   CT scan Feb 2021 - mild fatty liver, no active Crohn's otherwise   TPMT 10 TB negative 10/22/19 Hep testing negative B12 / iron studies okay   Fecal calprotectin 97 - intermediate   Colonoscopy 12/03/19 - The perianal and digital rectal examinations were  normal. - A mild stenosis was found at the IC valve but easily traversed. Patchy inflammation characterized by erosions and erythema was found in the terminal ileum. There appeared to be another area of stricturing a few cm proximal to the valve which was not traversed. - A benign-appearing, intrinsic moderate to severe stenosis was found in the cecum near the IC valve. This was able to be traversed to visualize the cecal cap, which was otherwise normal. Mild ulceration noted at the stricture. Biopsies were taken with a cold forceps for histology. - A 4 mm polyp was found in the sigmoid colon. The polyp was sessile. The polyp was removed with a cold snare. Resection and retrieval were complete. - Multiple small-mouthed diverticula were found in the left colon. - The exam was otherwise without abnormality. No other obvious inflammation noted.   1. Surgical [P], colon, cecal stricture - CHRONIC MILDLY ACTIVE COLITIS. - NO DYSPLASIA OR MALIGNANCY. 2. Surgical [P], random colon bx - BENIGN COLONIC MUCOSA. - NO ACTIVE INFLAMMATION. - NO DYSPLASIA OR MALIGNANCY. 3. Surgical [P], colon, sigmoid, polyp - HYPERPLASTIC POLYP. - NO DYSPLASIA OR MALIGNANCY.   MRE 01/09/22: IMPRESSION: 1. Imaging findings compatible with fibrostenotic disease of the distal/terminal ileum. 2. No convincing evidence of additional sites of bowel disease involvement. 3. Large volume of formed stool throughout the colon. 4. Left-sided colonic diverticulosis without findings of acute diverticulitis.    Past Medical History:  Diagnosis Date   Crohn's disease (HCC) 2006   Crohn's disease of both small and large intestine (HCC)    Diabetes (HCC)    Type 2   Hyperlipidemia 01/2020   Lichen planus    Restless leg syndrome    Sacroiliac inflammation (HCC)      Past Surgical History:  Procedure Laterality Date   CESAREAN SECTION     x 2   CHOLECYSTECTOMY     COLONOSCOPY  04/2018   Family History  Problem  Relation Age of Onset   Diabetes Mother    Blindness Father    Dementia Father    Diabetes Father    Crohn's disease Son    Colon cancer Neg Hx    Pancreatic cancer Neg Hx    Stomach cancer Neg Hx    Colon polyps Neg Hx    Esophageal cancer Neg Hx    Rectal cancer Neg Hx    Social History   Tobacco Use   Smoking status: Never   Smokeless tobacco: Never  Vaping Use   Vaping status: Never Used  Substance Use Topics   Alcohol use: Not Currently   Drug use: Never   Current Outpatient Medications  Medication Sig Dispense Refill   augmented betamethasone dipropionate (DIPROLENE-AF) 0.05 % ointment Apply 1 Application topically as needed.     blood glucose meter kit and supplies Dispense based on patient and insurance preference. Use up to four times daily as directed. (FOR ICD-10 E10.9, E11.9). 1 each 0  CHOLECALCIFEROL PO Take 1 capsule by mouth daily.     clobetasol cream (TEMOVATE) 0.05 % Apply 1 application  topically 2 (two) times daily.     Coenzyme Q10 200 MG capsule Take 200 mg by mouth daily.     cyanocobalamin 1000 MCG tablet Take 1,000 mcg by mouth daily.     dicyclomine (BENTYL) 10 MG capsule Take 1 capsule (10 mg total) by mouth every 8 (eight) hours as needed (abdominal cramping or pain). 30 capsule 2   hydroquinone 4 % cream Apply 1 Application topically 2 (two) times daily.     hydroxychloroquine (PLAQUENIL) 200 MG tablet Take 200 mg by mouth daily.     MOUNJARO 10 MG/0.5ML Pen Once weekly (Thursday)     nystatin cream (MYCOSTATIN) Apply 1 Application topically as needed.     ondansetron (ZOFRAN-ODT) 4 MG disintegrating tablet Take 1 tablet (4 mg total) by mouth every 6 (six) hours as needed for nausea or vomiting. 30 tablet 3   promethazine (PHENERGAN) 25 MG tablet Take 1 tablet by mouth every 4 (four) hours as needed.     Risankizumab-rzaa (SKYRIZI) 360 MG/2.4ML SOCT Inject 1 Pen into the skin every 8 (eight) weeks. 2.4 mL 6   rizatriptan (MAXALT) 10 MG tablet  Take 5 mg by mouth as needed.     rosuvastatin (CRESTOR) 10 MG tablet Take 10 mg by mouth daily.     sucralfate (CARAFATE) 1 g tablet TAKE 1 TABLET BY MOUTH EVERY 8 HOURS AS NEEDED. SLOWLY DISSOLVE TABLET IN 1 TABLESPOON OF DISTILLED WATER PRIOR TO INGESTING 180 tablet 1   triamcinolone cream (KENALOG) 0.1 % Apply 1 application  topically 2 (two) times daily.     No current facility-administered medications for this visit.   Allergies  Allergen Reactions   Humira (2 Pen) [Adalimumab] Rash    Other reaction(s): Unknown     Review of Systems: All systems reviewed and negative except where noted in HPI.   Lab Results  Component Value Date   WBC 7.9 02/23/2022   HGB 12.3 02/23/2022   HCT 36.9 02/23/2022   MCV 86.9 02/23/2022   PLT 274.0 02/23/2022    Lab Results  Component Value Date   NA 139 02/23/2022   CL 106 02/23/2022   K 3.7 02/23/2022   CO2 30 02/23/2022   BUN 14 02/23/2022   CREATININE 0.68 02/23/2022   GFR 98.06 02/23/2022   CALCIUM 9.4 02/23/2022   ALBUMIN 4.1 02/23/2022   GLUCOSE 75 02/23/2022    Lab Results  Component Value Date   ALT 15 02/23/2022   AST 17 02/23/2022   ALKPHOS 65 02/23/2022   BILITOT 0.3 02/23/2022     Physical Exam: BP 112/70 (BP Location: Left Arm, Patient Position: Sitting, Cuff Size: Normal)   Pulse 80   Ht 5\' 1"  (1.549 m)   Wt 154 lb (69.9 kg)   BMI 29.10 kg/m  Constitutional: Pleasant,well-developed, female in no acute distress. Neurological: Alert and oriented to person place and time. Psychiatric: Normal mood and affect. Behavior is normal.   ASSESSMENT: 57 y.o. female here for assessment of the following  1. Crohn's disease of both small and large intestine with other complication (HCC)   2. High risk medication use    As above she has failed multiple regimens to date including Humira, Remicade, Stelara.  On Skyrizi since September.  History of C. difficile as well.  She continues to have some abdominal pain  postprandially that bothers her.  Given description  of symptoms and lack of significant benefit over time with Cristy Folks, it is quite possible her symptoms could be fibrostenotic in nature not necessarily due to active inflammation right now.  We discussed ways to evaluate this.  I am recommending a colonoscopy to evaluate for active inflammation and to assess severity of stenosis.  If there is no active inflammation, she may benefit from surgical resection of stenosed area of her bowel.  If we are considering this we may also need to repeat an MR enterography to assess extent of disease for surgical planning.  I discussed colonoscopy with her, risks /benefits of the exam and anesthesia and she wants to proceed.  She will continue Skyrizi in the interim.  Otherwise we will send her to the lab for basic blood work today and inflammatory markers.  Counseled her on a low residue diet she should stay on this for now to help minimize symptoms.  Further recommendations pending her colonoscopy.  She agrees  PLAN: - lab today - CBC, CMET, ESR, CRP - schedule colonoscopy at the [facility] - likely next week if she can get a ride coordinated. Suspect she may need a resection if symptoms from fibrotic changes - continue Skyrizi for now - low residual diet - handout provided - vaccines UTD - completed Shingrix series since last visit  Harlin Rain, MD [facility]

## 2022-10-30 NOTE — Patient Instructions (Addendum)
You have been scheduled for a colonoscopy. Please follow written instructions given to you at your visit today.   Please pick up your prep supplies at the pharmacy within the next 1-3 days. If you use inhalers (even only as needed), please bring them with you on the day of your procedure.  DO NOT TAKE 7 DAYS PRIOR TO TEST- Trulicity (dulaglutide) Ozempic, Wegovy (semaglutide) Mounjaro (tirzepatide) Bydureon Bcise (exanatide extended release)  DO NOT TAKE 1 DAY PRIOR TO YOUR TEST Rybelsus (semaglutide) Adlyxin (lixisenatide) Victoza (liraglutide) Byetta (exanatide) ___________________________________________________________________________  Please go to the lab in the basement of our building to have lab work done as you leave today. Hit "B" for basement when you get on the elevator.  When the doors open the lab is on your left.  We will call you with the results. Thank you.  We are giving you a handout on a Low Residue Diet today.  Continue Skyrizi.  Thank you for entrusting me with your care and for choosing [facility], Dr. Ileene Patrick  If your blood pressure at your visit was 140/90 or greater, please contact your primary care physician to follow up on this. ______________________________________________________  If you are age 33 or older, your body mass index should be between 23-30. Your Body mass index is 29.1 kg/m. If this is out of the aforementioned range listed, please consider follow up with your Primary Care Provider.  If you are age 19 or younger, your body mass index should be between 19-25. Your Body mass index is 29.1 kg/m. If this is out of the aformentioned range listed, please consider follow up with your Primary Care Provider.  ________________________________________________________  The [facility] providers would like to encourage you to use [facility] to communicate with providers for non-urgent requests or questions.  Due to long hold times on  the telephone, sending your provider a message by [facility] may be a faster and more efficient way to get a response.  Please allow 48 business hours for a response.  Please remember that this is for non-urgent requests.  _______________________________________________________  Due to recent changes in healthcare laws, you may see the results of your imaging and laboratory studies on MyChart before your provider has had a chance to review them.  We understand that in some cases there may be results that are confusing or concerning to you. Not all laboratory results come back in the same time frame and the provider may be waiting for multiple results in order to interpret others.  Please give [place] 48 hours in order for your provider to thoroughly review all the results before contacting the office for clarification of your results.

## 2022-11-05 ENCOUNTER — Encounter: Payer: Self-pay | Admitting: Gastroenterology

## 2022-11-05 ENCOUNTER — Ambulatory Visit (AMBULATORY_SURGERY_CENTER): Payer: BC Managed Care – PPO | Admitting: Gastroenterology

## 2022-11-05 VITALS — BP 102/68 | HR 97 | Temp 98.4°F | Resp 19 | Ht 61.0 in | Wt 154.0 lb

## 2022-11-05 DIAGNOSIS — K50818 Crohn's disease of both small and large intestine with other complication: Secondary | ICD-10-CM

## 2022-11-05 DIAGNOSIS — D123 Benign neoplasm of transverse colon: Secondary | ICD-10-CM

## 2022-11-05 MED ORDER — DEXTROSE 5 % IV SOLN: INTRAVENOUS | Status: AC

## 2022-11-05 MED ORDER — SODIUM CHLORIDE 0.9 % IV SOLN: 500.0000 mL | INTRAVENOUS | Status: DC

## 2022-11-05 NOTE — Progress Notes (Signed)
Patient states there have been no changes to medical or surgical history since time of pre-visit. 

## 2022-11-05 NOTE — Progress Notes (Signed)
History and Physical Interval Note: See last office note from 10/30/22 for details - history of Crohn's disease on Skyrizi since Sept 2023. Initially had improvement but having problems with abdominal pains. Colonoscopy to restage Crohn's, assess for active inflammation vs. fibrotic disease. No interval changes.   11/05/2022 1:17 PM  Sarah Meadows  has presented today for endoscopic procedure(s), with the diagnosis of  Encounter Diagnosis  Name Primary?   Crohn's disease of both small and large intestine with other complication (HCC) Yes  .  The various methods of evaluation and treatment have been discussed with the patient and/or family. After consideration of risks, benefits and other options for treatment, the patient has consented to  the endoscopic procedure(s).   The patient's history has been reviewed, patient examined, no change in status, stable for surgery.  I have reviewed the patient's chart and labs.  Questions were answered to the patient's satisfaction.    Harlin Rain, MD [facility]

## 2022-11-05 NOTE — Patient Instructions (Addendum)
Resume previous diet (Low residue diet) Continue present medications Await pathology results Handouts/information given for polyps, diverticulosis and low residue(low fiber)diet  YOU HAD AN ENDOSCOPIC PROCEDURE TODAY AT THE [facility]:   Refer to the procedure report that was given to you for any specific questions about what was found during the examination.  If the procedure report does not answer your questions, please call your gastroenterologist to clarify.  If you requested that your care partner not be given the details of your procedure findings, then the procedure report has been included in a sealed envelope for you to review at your convenience later.  YOU SHOULD EXPECT: Some feelings of bloating in the abdomen. Passage of more gas than usual.  Walking can help get rid of the air that was put into your GI tract during the procedure and reduce the bloating. If you had a lower endoscopy (such as a colonoscopy or flexible sigmoidoscopy) you may notice spotting of blood in your stool or on the toilet paper. If you underwent a bowel prep for your procedure, you may not have a normal bowel movement for a few days.  Please Note:  You might notice some irritation and congestion in your nose or some drainage.  This is from the oxygen used during your procedure.  There is no need for concern and it should clear up in a day or so.  SYMPTOMS TO REPORT IMMEDIATELY:  Following lower endoscopy (colonoscopy):  Excessive amounts of blood in the stool  Significant tenderness or worsening of abdominal pains  Swelling of the abdomen that is new, acute  Fever of 100F or higher  For urgent or emergent issues, a gastroenterologist can be reached at any hour by calling [phone]. Do not use MyChart messaging for urgent concerns.    DIET:  We do recommend a small meal at first, but then you may proceed to your regular diet.  Drink plenty of fluids but you should avoid alcoholic beverages  for 24 hours.  ACTIVITY:  You should plan to take it easy for the rest of today and you should NOT DRIVE or use heavy machinery until tomorrow (because of the sedation medicines used during the test).    FOLLOW UP: Our staff will call the number listed on your records the next business day following your procedure.  We will call around 7:15- 8:00 am to check on you and address any questions or concerns that you may have regarding the information given to you following your procedure. If we do not reach you, we will leave a message.     If any biopsies were taken you will be contacted by phone or by letter within the next 1-3 weeks.  Please call us at [phone] if you have not heard about the biopsies in 3 weeks.    SIGNATURES/CONFIDENTIALITY: You and/or your care partner have signed paperwork which will be entered into your electronic medical record.  These signatures attest to the fact that that the information above on your After Visit Summary has been reviewed and is understood.  Full responsibility of the confidentiality of this discharge information lies with you and/or your care-partner.

## 2022-11-05 NOTE — Progress Notes (Signed)
Blood glucose low in 50s prior to procedure. D5W hung and started in pre-procedure area. Recheck in 60s. CRNA gave 12.5 g D50 bolus in pre-procedure area.  Uneventful anesthetic. Report to pacu rn. Vss. Care resumed by rn.

## 2022-11-05 NOTE — Progress Notes (Signed)
Called to room to assist during endoscopic procedure.  Patient ID and intended procedure confirmed with present staff. Received instructions for my participation in the procedure from the performing physician.  

## 2022-11-05 NOTE — Op Note (Signed)
[facility] Patient Name: Sarah Meadows Procedure Date: 11/05/2022 1:12 PM MRN: [id number] Endoscopist: Viviann Spare P. Adela Lank , MD, [id number] Age: 57 Referring MD:  Date of Birth: [birth year] Gender: Female Account #: [id number] Procedure:                Colonoscopy Indications:              Disease activity assessment of Crohn's disease of                            the small bowel and right colon - on Skirizi since                            Sept 23, ongoing abdominal pain Medicines:                Monitored Anesthesia Care Procedure:                Pre-Anesthesia Assessment:                           - Prior to the procedure, a History and Physical                            was performed, and patient medications and                            allergies were reviewed. The patient's tolerance of                            previous anesthesia was also reviewed. The risks                            and benefits of the procedure and the sedation                            options and risks were discussed with the patient.                            All questions were answered, and informed consent                            was obtained. Prior Anticoagulants: The patient has                            taken no anticoagulant or antiplatelet agents. ASA                            Grade Assessment: II - A patient with mild systemic                            disease. After reviewing the risks and benefits,                            the patient was deemed in satisfactory condition to  undergo the procedure.                           After obtaining informed consent, the colonoscope                            was passed under direct vision. Throughout the                            procedure, the patient's blood pressure, pulse, and                            oxygen saturations were monitored continuously. The                            Olympus Scope  [id number] was introduced through the                            anus and advanced to the the terminal ileum, with                            identification of the appendiceal orifice and IC                            valve. The colonoscopy was performed without                            difficulty. The patient tolerated the procedure                            well. The quality of the bowel preparation was                            good. The terminal ileum, ileocecal valve,                            appendiceal orifice, and rectum were photographed. Scope In: 1:31:53 PM Scope Out: 1:48:36 PM Scope Withdrawal Time: 0 hours 12 minutes 1 second  Total Procedure Duration: 0 hours 16 minutes 43 seconds  Findings:                 The perianal and digital rectal examinations were                            normal.                           The terminal ileum contained a benign-appearing,                            intrinsic stenosis that was non-traversed. This was                            perhaps 5cm proximal to the entrance of the ileum.  The remainder of the exam in the terminal ileum was                            normal. Interval healing of inflammation.                           A benign-appearing, intrinsic mild stenosis was                            found in the cecum. Stable compared to prior exams.                            Biopsies were taken with a cold forceps for                            histology.                           A 6 mm polyp was found in the transverse colon. The                            polyp was flat. The polyp was removed with a cold                            snare. Resection and retrieval were complete.                           Many small-mouthed diverticula were found in the                            transverse colon and left colon.                           Internal hemorrhoids were found.                           The exam  was otherwise without abnormality.                           Biopsies were taken with a cold forceps in the                            ascending colon and in the cecum for histology. Complications:            No immediate complications. Estimated blood loss:                            Minimal. Estimated Blood Loss:     Estimated blood loss was minimal. 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 Biopsied.                           - One 6 mm polyp in the transverse colon, removed                            with a cold snare. Resected and retrieved.                           - Diverticulosis in the transverse colon and in the                            left colon.                           - Internal hemorrhoids.                           - The examination was otherwise normal.                           - Biopsies were taken with a cold forceps for                            histology in the ascending colon and in the cecum.                           Overall no active inflammation appreciated,                            interval healing on Skirizi. Given endoscopic                            findings and prior MRE, suspect she has fibrotic /                            stricturing disease that is causing her symptoms. Recommendation:           - Patient has a contact number available for                            emergencies. The signs and symptoms of potential                            delayed complications were discussed with the                            patient. Return to normal activities tomorrow.                            Written discharge instructions were provided to the                            patient.                           -  Resume previous diet (low residual diet)                           - Continue present  medications.                           - Await pathology results.                           - Referral to colorectal surgery to discuss                            resection of ileal strictured disease, if patient                            is willing Tysha Grismore P. Jasime Westergren, MD 11/05/2022 1:58:56 PM This report has been signed electronically.

## 2022-11-06 ENCOUNTER — Telehealth: Payer: Self-pay | Admitting: *Deleted

## 2022-11-06 NOTE — Telephone Encounter (Signed)
-----   Message from Nurse Denning P sent at 11/06/2022  8:29 AM EDT ----- Regarding: FW: referral to colorectal surgery [facility]  ----- Message ----- From: Benancio Deeds, MD Sent: 11/05/2022   5:39 PM EDT To: Missy Sabins, RN Subject: referral to colorectal surgery [facility]             Sharol Harness can you please refer this patient to colorectal surgery at [facility] - Crohn's disease, small bowel stricture. Thanks

## 2022-11-06 NOTE — Telephone Encounter (Signed)
  Follow up Call-     11/05/2022    1:10 PM  Call back number  Post procedure Call Back phone  # [phone]     Patient questions:  Do you have a fever, pain , or abdominal swelling? No. Pain Score  0 *  Have you tolerated food without any problems? Yes.    Have you been able to return to your normal activities? Yes.    Do you have any questions about your discharge instructions: Diet   No. Medications  No. Follow up visit  No.  Do you have questions or concerns about your Care? No.  Actions: * If pain score is 4 or above: No action needed, pain <4.

## 2022-11-06 NOTE — Telephone Encounter (Signed)
Called patient to inform of referral via Dr. Adela Lank for colorectal surgery at [facility] due to Cronh's Disease and small bowel stricture. Also informed patient that [facility] will be calling to notify and discuss an appt date. Patient understood and agreed.

## 2022-11-28 ENCOUNTER — Ambulatory Visit: Payer: Self-pay | Admitting: Surgery

## 2022-11-28 ENCOUNTER — Encounter: Payer: Self-pay | Admitting: Surgery

## 2022-11-28 DIAGNOSIS — R739 Hyperglycemia, unspecified: Secondary | ICD-10-CM

## 2022-11-28 DIAGNOSIS — K50012 Crohn's disease of small intestine with intestinal obstruction: Principal | ICD-10-CM | POA: Diagnosis present

## 2022-12-06 ENCOUNTER — Telehealth: Payer: Self-pay | Admitting: Gastroenterology

## 2022-12-06 NOTE — Telephone Encounter (Signed)
[facility] rep called to advise they are starting a new PA for [facility] medication. Key#[id number] tel: [phone].

## 2022-12-10 ENCOUNTER — Telehealth: Payer: Self-pay

## 2022-12-10 ENCOUNTER — Other Ambulatory Visit (HOSPITAL_COMMUNITY): Payer: Self-pay

## 2022-12-10 NOTE — Telephone Encounter (Signed)
Pharmacy Patient Advocate Encounter   Received notification from Pt Calls Messages that prior authorization for Skyrizi 360MG /2.4ML (150MG /ML) single-dose prefilled cartridge with on-body injector is required/requested.   Insurance verification completed.   The patient is insured through [facility] .   Per test claim: PA required; PA submitted to [facility] via CoverMyMeds Key/confirmation #/EOC [id number] Status is pending

## 2022-12-12 NOTE — Telephone Encounter (Signed)
That key was expired. A new PA request has been Submitted. New Encounter created for follow up. For additional info see Pharmacy Prior Auth telephone encounter from 12-10-2022.

## 2022-12-19 NOTE — Telephone Encounter (Signed)
Pharmacy Patient Advocate Encounter  Received notification from [facility] that Prior Authorization for Sarah Meadows has been DENIED.  Full denial letter will be uploaded to the media tab. See denial reason below.   PA #/Case ID/Reference #: additional information needed. May appeal as stated in denial letter attached in patients media

## 2022-12-24 NOTE — Telephone Encounter (Signed)
10/30/22 office note and 11/05/22 colonoscopy/pathology report printed and faxed to [facility] specialty appeals department at [phone] - URGENT APPEAL request.

## 2023-01-01 ENCOUNTER — Other Ambulatory Visit (HOSPITAL_COMMUNITY): Payer: Self-pay

## 2023-01-01 NOTE — Telephone Encounter (Signed)
Pharmacy Patient Advocate Encounter  Received notification from [facility] that Prior Authorization for [facility] 360MG  has been APPROVED from 9.9.24 to 9.9.25. Ran test claim, Copay is $-. This test claim was processed through [facility]- copay amounts may vary at other pharmacies due to pharmacy/plan contracts, or as the patient moves through the different stages of their insurance plan.   PA #/Case ID/Reference #: [id number]

## 2023-01-02 NOTE — Telephone Encounter (Signed)
Called and informed patient that appeal for Sarah Meadows was approved and she should be able to contact [facility] at this time to fill her prescription. Pt verbalized understanding.

## 2023-02-03 ENCOUNTER — Emergency Department (HOSPITAL_COMMUNITY): Payer: BC Managed Care – PPO

## 2023-02-03 ENCOUNTER — Emergency Department (HOSPITAL_COMMUNITY)
Admission: EM | Admit: 2023-02-03 | Discharge: 2023-02-03 | Disposition: A | Discharge status: Home / Self Care | Payer: BC Managed Care – PPO | Attending: Emergency Medicine | Admitting: Emergency Medicine

## 2023-02-03 ENCOUNTER — Other Ambulatory Visit: Payer: Self-pay

## 2023-02-03 ENCOUNTER — Encounter (HOSPITAL_COMMUNITY): Payer: Self-pay | Admitting: *Deleted

## 2023-02-03 DIAGNOSIS — R519 Headache, unspecified: Secondary | ICD-10-CM | POA: Insufficient documentation

## 2023-02-03 DIAGNOSIS — G43809 Other migraine, not intractable, without status migrainosus: Secondary | ICD-10-CM

## 2023-02-03 DIAGNOSIS — H1132 Conjunctival hemorrhage, left eye: Secondary | ICD-10-CM | POA: Diagnosis not present

## 2023-02-03 LAB — CBC
HCT: 38.8 % (ref 36.0–46.0)
Hemoglobin: 12.8 g/dL (ref 12.0–15.0)
MCH: 30.3 pg (ref 26.0–34.0)
MCHC: 33 g/dL (ref 30.0–36.0)
MCV: 91.9 fL (ref 80.0–100.0)
Platelets: 268 10*3/uL (ref 150–400)
RBC: 4.22 MIL/uL (ref 3.87–5.11)
RDW: 13.1 % (ref 11.5–15.5)
WBC: 5 10*3/uL (ref 4.0–10.5)
nRBC: 0 % (ref 0.0–0.2)

## 2023-02-03 LAB — BASIC METABOLIC PANEL
Anion gap: 7 (ref 5–15)
BUN: 15 mg/dL (ref 6–20)
CO2: 25 mmol/L (ref 22–32)
Calcium: 9.3 mg/dL (ref 8.9–10.3)
Chloride: 109 mmol/L (ref 98–111)
Creatinine, Ser: 0.68 mg/dL (ref 0.44–1.00)
GFR, Estimated: >60 mL/min (ref >60)
Glucose, Bld: 89 mg/dL (ref 70–99)
Potassium: 3.7 mmol/L (ref 3.5–5.1)
Sodium: 141 mmol/L (ref 135–145)

## 2023-02-03 LAB — CBG MONITORING, ED: Glucose-Capillary: 98 mg/dL (ref 70–99)

## 2023-02-03 LAB — SEDIMENTATION RATE: Sed Rate: 16 mm/h (ref 0–22)

## 2023-02-03 MED ORDER — DEXAMETHASONE SODIUM PHOSPHATE 10 MG/ML IJ SOLN
10.0000 mg | Freq: Once | INTRAMUSCULAR | Status: AC
  Administered 2023-02-03: 10 mg via INTRAVENOUS

## 2023-02-03 MED ORDER — DEXAMETHASONE SODIUM PHOSPHATE 10 MG/ML IJ SOLN
10.0000 mg | Freq: Once | INTRAMUSCULAR | Status: DC
  Filled 2023-02-03: qty 1

## 2023-02-03 MED ORDER — TETRACAINE HCL 0.5 % OP SOLN
1.0000 [drp] | Freq: Once | OPHTHALMIC | Status: AC
  Administered 2023-02-03: 1 [drp] via OPHTHALMIC

## 2023-02-03 MED ORDER — METOCLOPRAMIDE HCL 5 MG/ML IJ SOLN
10.0000 mg | Freq: Once | INTRAMUSCULAR | Status: DC
  Filled 2023-02-03: qty 2

## 2023-02-03 MED ORDER — METOCLOPRAMIDE HCL 5 MG/ML IJ SOLN
10.0000 mg | Freq: Once | INTRAMUSCULAR | Status: AC
  Administered 2023-02-03: 10 mg via INTRAVENOUS

## 2023-02-03 MED ORDER — FLUORESCEIN SODIUM 1 MG OP STRP
1.0000 | ORAL_STRIP | Freq: Once | OPHTHALMIC | Status: AC
  Administered 2023-02-03: 1 via OPHTHALMIC
  Filled 2023-02-03: qty 1

## 2023-02-03 NOTE — ED Provider Triage Note (Signed)
Emergency Medicine Provider Triage Evaluation Note  Sarah Meadows , a 57 y.o. female  was evaluated in triage.  Pt complains of headache. Pain onset Thursday evening. Began to notice some blurry vision in the L eye only on Saturday. Reports associated redness of the L eye. No hx of eye trauma or similar headaches. Not chronically anticoagulated.  Review of Systems  Positive: As above Negative: As above  Physical Exam  BP (!) 150/91 (BP Location: Right Arm)   Pulse 87   Temp 99 F (37.2 C) (Oral)   Resp 18   Ht 5\' 1"  (1.549 m)   Wt 68 kg   SpO2 100%   BMI 28.34 kg/m  Gen:   Awake, no distress   Resp:  Normal effort  MSK:   Moves extremities without difficulty. No meningismus. Other:  IOP 16 OS w/90% CI. Abbreviated neurologic assessment in triage without obvious focal deficit.  Medical Decision Making  Medically screening exam initiated at 5:37 AM.  Appropriate orders placed.  Sarah Meadows was informed that the remainder of the evaluation will be completed by another provider, this initial triage assessment does not replace that evaluation, and the importance of remaining in the ED until their evaluation is complete.  New onset headache with vision changes. Meds given. Pending labs, ESR, head CT. IOP normal; doubt acute glaucoma.   Antony Madura, PA-C 02/03/23 [phone]

## 2023-02-03 NOTE — ED Notes (Signed)
All results reviewed at [facility] with patient by EDP. Reviewed instructions, prescriptions and follow up information with patient. Verbalized understanding. All questions answered, offers no complaints. No acute distress, escorted to ED lobby,steady gait noted. Stable condition.

## 2023-02-03 NOTE — Discharge Instructions (Signed)
Please follow-up with your primary care provider regards to recent ER visit along with your ophthalmologist.  Today your labs and imaging are reassuring and your headache is most likely a migraine.  You may take Tylenol every 6 hours as needed for pain.  Your eye appears to have a subconjunctival hemorrhage which is benign in nature however you may follow-up with your eye doctor as symptoms may change.  If symptoms do change or worsen please return to ER.

## 2023-02-03 NOTE — ED Triage Notes (Addendum)
Pt says that she has been having a left sided headache for several days. Noticed redness in left eye yesterday. Feels like she is coming down with an earache. Blurry vision (even with eye glasses on) Saturday morning. Denies n/v/d.

## 2023-02-03 NOTE — ED Notes (Signed)
Visual acuity test performed at bedside with 20/40 on L and R

## 2023-02-03 NOTE — ED Provider Notes (Signed)
Sarah Meadows EMERGENCY DEPARTMENT AT [facility] Provider Note   CSN: [id number] Arrival date & time: 02/03/23  0500     History  Chief Complaint  Patient presents with   Headache    Sarah Meadows is a 57 y.o. female history of Crohn's, diverticulitis, migraines presented with left-sided headache for the past week.  Patient states that she was driving to [facility] when she first noticed the headache on the left side.  Patient states that initially she was having blurred vision however this is since resolved but does note that last night her left eye does appear red as if her blood vessels popped.  Patient denies any history of hypertension or stress or trauma.  Patient states that she can see you now.  Patient states that the headache was from the left side of her head down her neck to her left arm and states this feels different than the previous migraines however has not had a migraine in quite some time and is no longer on antimigraine medication.  Patient denies fevers, jaw claudication, changes in sensation/motor skills.  Patient denies foreign body sensation.    Home Medications Prior to Admission medications   Medication Sig Start Date End Date Taking? Authorizing Provider  augmented betamethasone dipropionate (DIPROLENE-AF) 0.05 % ointment Apply 1 Application topically as needed.    [provider]  blood glucose meter kit and supplies Dispense based on patient and insurance preference. Use up to four times daily as directed. (FOR ICD-10 E10.9, E11.9). 02/17/20   Kallie Locks, FNP  CHOLECALCIFEROL PO Take 1 capsule by mouth daily. 05/22/22   [provider]  clobetasol cream (TEMOVATE) 0.05 % Apply 1 application  topically 2 (two) times daily.    [provider]  Coenzyme Q10 200 MG capsule Take 200 mg by mouth daily. 05/22/22   [provider]  cyanocobalamin 1000 MCG tablet Take 1,000 mcg by mouth daily. 05/22/22   [provider]  dextrose 5 % Inject 250 mLs into the vein once.    [provider]  dicyclomine (BENTYL) 10 MG capsule Take 1 capsule (10 mg total) by mouth every 8 (eight) hours as needed (abdominal cramping or pain). 12/21/21   Armbruster, Willaim Rayas, MD  hydroquinone 4 % cream Apply 1 Application topically 2 (two) times daily. 09/05/22   [provider]  hydroxychloroquine (PLAQUENIL) 200 MG tablet Take 200 mg by mouth daily. Patient not taking: Reported on 11/05/2022 08/01/22   [provider]  Greggory Keen 10 MG/0.5ML Pen Once weekly (Thursday) 12/05/21   [provider]  nystatin cream (MYCOSTATIN) Apply 1 Application topically as needed. 04/03/22   [provider]  ondansetron (ZOFRAN-ODT) 4 MG disintegrating tablet Take 1 tablet (4 mg total) by mouth every 6 (six) hours as needed for nausea or vomiting. 12/21/21   Armbruster, Willaim Rayas, MD  PEG-KCl-NaCl-NaSulf-Na Asc-C (PLENVU) 140 g SOLR Take 1 kit by mouth as directed. Use coupon: BIN: [id number] [facility]: [employer] Group: [id number] ID: [id number] 10/30/22   Benancio Deeds, MD  promethazine (PHENERGAN) 25 MG tablet Take 1 tablet by mouth every 4 (four) hours as needed.    [provider]  Risankizumab-rzaa (SKYRIZI) 360 MG/2.4ML SOCT Inject 1 Pen into the skin every 8 (eight) weeks. 07/27/22   Armbruster, Willaim Rayas, MD  rizatriptan (MAXALT) 10 MG tablet Take 5 mg by mouth as needed.    [provider]  rosuvastatin (CRESTOR) 10 MG tablet Take 10 mg by mouth daily.  [provider]  sucralfate (CARAFATE) 1 g tablet TAKE 1 TABLET BY MOUTH EVERY 8 HOURS AS NEEDED. SLOWLY DISSOLVE TABLET IN 1 TABLESPOON OF DISTILLED WATER PRIOR TO INGESTING 01/25/20   Armbruster, Willaim Rayas, MD  triamcinolone cream (KENALOG) 0.1 % Apply 1 application  topically 2 (two) times daily.    [provider]      Allergies    Humira (2 pen) [adalimumab] and Nsaids    Review of Systems   Review of Systems   Neurological:  Positive for headaches.    Physical Exam Updated Vital Signs BP 122/77   Pulse 88   Temp 98.9 F (37.2 C)   Resp 18   Ht 5\' 1"  (1.549 m)   Wt 68 kg   SpO2 97%   BMI 28.34 kg/m  Physical Exam Constitutional:      Appearance: She is normal weight.  HENT:     Head: Normocephalic.     Comments: No temporal artery tenderness No jaw claudication Eyes:     General: Lids are normal. Lids are everted, no foreign bodies appreciated. Vision grossly intact. Gaze aligned appropriately. No visual field deficit.       Right eye: No foreign body, discharge or hordeolum.        Left eye: No foreign body, discharge or hordeolum.     Intraocular pressure: Right eye pressure is 16 mmHg. Left eye pressure is 16 mmHg.     Extraocular Movements: Extraocular movements intact.     Pupils: Pupils are equal, round, and reactive to light.     Comments: Left eye subconjunctival hemorrhage Visual fields intact bilaterally Vision grossly intact No fluorescein reuptake  Cardiovascular:     Rate and Rhythm: Normal rate and regular rhythm.     Pulses: Normal pulses.     Heart sounds: Normal heart sounds.  Pulmonary:     Effort: Pulmonary effort is normal.     Breath sounds: Normal breath sounds.  Abdominal:     Palpations: Abdomen is soft.     Tenderness: There is no abdominal tenderness. There is no guarding or rebound.  Musculoskeletal:        General: Normal range of motion.     Cervical back: Normal range of motion. No rigidity or tenderness.  Skin:    General: Skin is warm and dry.     Capillary Refill: Capillary refill takes less than 2 seconds.  Neurological:     General: No focal deficit present.     Mental Status: She is alert.     Sensory: Sensation is intact.     Motor: Motor function is intact.     Coordination: Coordination is intact.     Gait: Gait is intact.     Comments: Cranial nerves III through XII intact Vision grossly intact Sensation tact in all 4  extremities  Psychiatric:        Mood and Affect: Mood normal.     ED Results / Procedures / Treatments   Labs (all labs ordered are listed, but only abnormal results are displayed) Labs Reviewed  CBC  BASIC METABOLIC PANEL  SEDIMENTATION RATE  CBG MONITORING, ED    EKG None  Radiology CT Head Wo Contrast  Result Date: 02/03/2023 CLINICAL DATA:  Left-sided headache for several days EXAM: CT HEAD WITHOUT CONTRAST TECHNIQUE: Contiguous axial images were obtained from the base of the skull through the vertex without intravenous contrast. RADIATION DOSE REDUCTION: This exam was performed according to the departmental dose-optimization program  which includes automated exposure control, adjustment of the mA and/or kV according to patient size and/or use of iterative reconstruction technique. COMPARISON:  None Available. FINDINGS: Brain: No evidence of acute infarction, hemorrhage, hydrocephalus, extra-axial collection or mass lesion/mass effect. Vascular: No hyperdense vessel or unexpected calcification. Skull: Normal. Negative for fracture or focal lesion. Incidental hyperostosis interna. Sinuses/Orbits: No acute finding. IMPRESSION: Normal head CT. Electronically Signed   By: Tiburcio Pea M.D.   On: 02/03/2023 06:16    Procedures Procedures    Medications Ordered in ED Medications  metoCLOPramide (REGLAN) injection 10 mg (has no administration in time range)  dexamethasone (DECADRON) injection 10 mg (has no administration in time range)  tetracaine (PONTOCAINE) 0.5 % ophthalmic solution 1 drop (1 drop Left Eye Given by Other 02/03/23 0550)    ED Course/ Medical Decision Making/ A&P                                 Medical Decision Making Risk Prescription drug management.   Sarah Meadows 57 y.o. presented today for headache. Working DDx that I considered at this time includes, but not limited to, tension headache, migraine, intracranial mass, intracranial hemorrhage,  intracranial infection including meningitis vs encephalitis, GCA, trigeminal neuralgia, preeclampsia/eclampsia, AVM, sinusitis, cerebral aneurysm, muscular headache, cavernous sinus thrombosis, carotid artery dissection.  R/o DDx: intracranial mass, intracranial hemorrhage, intracranial infection including meningitis vs encephalitis, GCA, trigeminal neuralgia, preeclampsia/eclampsia, AVM, sinusitis, cerebral aneurysm, muscular headache, cavernous sinus thrombosis, carotid artery dissection:  less likely due to history of present illness, physical exam, labs/imaging findings  Review of prior external notes: 11/28/2022 initial consult  Unique Tests and My Interpretation:  CBC: Unremarkable CMP: Unremarkable ESR: Unremarkable CT Head w/o Contrast: No acute pathology visual acuity: 20/40 bilaterally, 20/40 right eye, 20/40 left eye IOP: 16 left eye Fluorescein stain: No reuptake  Discussion with Independent Historian:  Daughter  Discussion of Management of Tests: None  Risk: Medium: prescription drug management  Risk Stratification Score: None  Staffed with Deretha Emory, MD  Plan: On exam patient was no acute distress with stable vitals. Physical exam was remarkable for left-sided subconjunctival hemorrhage however there is her exam is reassuring including a neurologic exam.  Patient did not have temporal artery tenderness and no jaw claudication however ESR was ordered from triage and so we will proceed with that.  CT scan was negative from triage.  Will await the rest of labs and give Reglan, Decadron as per the previous provider.  Will stain patient's eye to further evaluate. patient stable at this time.  On reassessment patient says she is feeling better after the headache cocktail.  Will stain eye to make sure her headache is not coming from her eye although it appears she has a subconjunctival hemorrhage which I believe is separate from her headache due to the headache being present for a  week and the hemorrhage been present since yesterday.  Fluorescein stain was conducted which was unremarkable.  Intraocular pressure from triage was 16 and so at this point not concerned for acute glaucoma or other ocular pathologies.  Suspect patient does have benign subconjunctival hemorrhage which is separate from the headache.  Headache did improve with medicine and patient states that she wants to be discharged.  Will discharge with primary care follow-up and ophthalmology follow-up.  Encourage patient take Tylenol every 6 hours as needed for pain.  Patient headache treated and improved while in ED. Patient is to  follow up with PCP to discuss prophylactic medication.   Patient was given return precautions. Patient stable for discharge at this time.  Patient verbalized understanding of plan.  This chart was dictated using voice recognition software.  Despite best efforts to proofread,  errors can occur which can change the documentation meaning.         Final Clinical Impression(s) / ED Diagnoses Final diagnoses:  None    Rx / DC Orders ED Discharge Orders     None         Remi Deter 02/03/23 0957    Vanetta Mulders, MD 02/06/23 [phone]

## 2023-02-03 NOTE — ED Notes (Signed)
Patient transported to CT 

## 2023-02-03 NOTE — ED Notes (Signed)
ED Provider at bedside. 

## 2023-02-22 ENCOUNTER — Encounter: Payer: Self-pay | Admitting: Gastroenterology

## 2023-03-07 ENCOUNTER — Ambulatory Visit: Payer: Self-pay | Admitting: Surgery

## 2023-03-07 NOTE — Progress Notes (Addendum)
COVID Vaccine Completed:  Yes  Date of COVID positive in last 90 days:  No  PCP - Henry Russel, MD Cardiologist - N/A  Chest x-ray -  N/A EKG - 03-08-23 Epic Stress Test -  N/A ECHO -  N/A Cardiac Cath -  N/A Pacemaker/ICD device last checked: Spinal Cord Stimulator:   N/A  Bowel Prep -  N/A  Sleep Study -   N/A CPAP -   Fasting Blood Sugar - 95 to 105 Checks Blood Sugar - checks occasionally   Mounjaro on Wed Last dose of GLP1 agonist-  03-12-23 GLP1 instructions:  Hold 7 days before surgery    Last dose of SGLT-2 inhibitors-  N/A SGLT-2 instructions:  Hold 3 days before surgery    Blood Thinner Instructions: N/A Aspirin Instructions: Last Dose:  Activity level:  Can go up a flight of stairs and perform activities of daily living without stopping and without symptoms of chest pain or shortness of breath.  Anesthesia review:  N/A  Patient denies shortness of breath, fever, cough and chest pain at PAT appointment  Patient verbalized understanding of instructions that were given to them at the PAT appointment. Patient was also instructed that they will need to review over the PAT instructions again at home before surgery.

## 2023-03-07 NOTE — Patient Instructions (Addendum)
SURGICAL WAITING ROOM VISITATION Patients having surgery or a procedure may have no more than 2 support people in the waiting area - these visitors may rotate.    Children under the age of 40 must have an adult with them who is not the patient.  If the patient needs to stay at the hospital during part of their recovery, the visitor guidelines for inpatient rooms apply. Pre-op nurse will coordinate an appropriate time for 1 support person to accompany patient in pre-op.  This support person may not rotate.    Please refer to the [facility] website for the visitor guidelines for Inpatients (after your surgery is over and you are in a regular room).       Your procedure is scheduled on: 03-20-23   Report to [facility] Main Entrance    Report to admitting at 6:15 AM   Call this number if you have problems the morning of surgery [phone]   Follow a clear liquid diet the day of prep to prevent dehydration   After Midnight you may have the following liquids until 5:30 AM DAY OF SURGERY  Water Non-Citrus Juices (without pulp, NO RED-Apple, White grape, White cranberry) Black Coffee (NO MILK/CREAM OR CREAMERS, sugar ok)  Clear Tea (NO MILK/CREAM OR CREAMERS, sugar ok) regular and decaf                             Plain Jell-O (NO RED)                                           Fruit ices (not with fruit pulp, NO RED)                                     Popsicles (NO RED)                                                               Sports drinks like Gatorade (NO RED)  Drink 2 Pre-surgery G2 the evening before surgery                    The day of surgery:  Drink ONE (1) Pre-Surgery G2 by 5:30  AM the morning of surgery. Drink in one sitting. Do not sip.  This drink was given to you during your hospital  pre-op appointment visit. Nothing else to drink after completing the Pre-Surgery G2.          If you have questions, please contact your surgeon's office.   FOLLOW  BOWEL PREP AND ANY ADDITIONAL PRE OP INSTRUCTIONS YOU RECEIVED FROM YOUR SURGEON'S OFFICE!!!  -Clear liquids day of prep to prevent dehydration  - Bisacodyl (Dulcolax) 20 mg - Give with water the day prior to surgery.   - Miralax 255g - Mix with 64 oz Gatorade/Powerade.  Drink gradually over the next few hours (8 oz glass every 15-30 minutes) until gone the day prior to surgery.   -Metronidazole (Flagyl) 1000 mg - At 2 pm, 3 pm and 10 pm after Miralax bowel  prep the day prior to surgery.    -Neomycin 1000 mg - At 2 pm, 3 pm and 10 pm after Miralax  bowel prep the day prior to surgery.    Oral Hygiene is also important to reduce your risk of infection.                                    Remember - BRUSH YOUR TEETH THE MORNING OF SURGERY WITH YOUR REGULAR TOOTHPASTE   Do NOT smoke after Midnight   Take these medicines the morning of surgery with A SIP OF WATER:   Rosuvastatin  Stop all vitamins and herbal supplements 7 days before surgery  How to Manage Your Diabetes Before and After Surgery  Why is it important to control my blood sugar before and after surgery? Improving blood sugar levels before and after surgery helps healing and can limit problems. A way of improving blood sugar control is eating a healthy diet by:  Eating less sugar and carbohydrates  Increasing activity/exercise  Talking with your doctor about reaching your blood sugar goals High blood sugars (greater than 180 mg/dL) can raise your risk of infections and slow your recovery, so you will need to focus on controlling your diabetes during the weeks before surgery. Make sure that the doctor who takes care of your diabetes knows about your planned surgery including the date and location.  How do I manage my blood sugar before surgery? Check your blood sugar at least 4 times a day, starting 2 days before surgery, to make sure that the level is not too high or low. Check your blood sugar the morning of your surgery  when you wake up and every 2 hours until you get to the Short Stay unit. If your blood sugar is less than 70 mg/dL, you will need to treat for low blood sugar: Do not take insulin. Treat a low blood sugar (less than 70 mg/dL) with  cup of clear juice (cranberry or apple), 4 glucose tablets, OR glucose gel. Recheck blood sugar in 15 minutes after treatment (to make sure it is greater than 70 mg/dL). If your blood sugar is not greater than 70 mg/dL on recheck, call [phone] for further instructions. Report your blood sugar to the short stay nurse when you get to Short Stay.  If you are admitted to the hospital after surgery: Your blood sugar will be checked by the staff and you will probably be given insulin after surgery (instead of oral diabetes medicines) to make sure you have good blood sugar levels. The goal for blood sugar control after surgery is 80-180 mg/dL.   WHAT DO I DO ABOUT MY DIABETES MEDICATION?  Do not take oral diabetes medicines (pills) the morning of surgery.  Hold Mounjaro 7 days before surgery (do not take after 03-12-23)  DO NOT TAKE THE FOLLOWING 7 DAYS PRIOR TO SURGERY: Ozempic, Wegovy, Rybelsus (Semaglutide), Byetta (exenatide), Bydureon (exenatide ER), Victoza, Saxenda (liraglutide), or Trulicity (dulaglutide) Mounjaro (Tirzepatide) Adlyxin (Lixisenatide), Polyethylene Glycol Loxenatide.  Reviewed and Endorsed by [facility] Patient Education Committee, August 2015                              You may not have any metal on your body including hair pins, jewelry, and body piercing             Do  not wear make-up, lotions, powders, perfumes or deodorant  Do not wear nail polish including gel and S&S, artificial/acrylic nails, or any other type of covering on natural nails including finger and toenails. If you have artificial nails, gel coating, etc. that needs to be removed by a nail salon please have this removed prior to surgery or surgery may need to be  canceled/ delayed if the surgeon/ anesthesia feels like they are unable to be safely monitored.   Do not shave  48 hours prior to surgery.     Do not bring valuables to the hospital. [facility] IS NOT RESPONSIBLE   FOR VALUABLES.   Contacts, dentures or bridgework may not be worn into surgery.   Bring small overnight bag day of surgery.   DO NOT BRING YOUR HOME MEDICATIONS TO THE HOSPITAL. PHARMACY WILL DISPENSE MEDICATIONS LISTED ON YOUR MEDICATION LIST TO YOU DURING YOUR ADMISSION IN THE HOSPITAL!               Please read over the following fact sheets you were given: IF YOU HAVE QUESTIONS ABOUT YOUR PRE-OP INSTRUCTIONS PLEASE CALL [phone] Gwen  If you received a COVID test during your pre-op visit  it is requested that you wear a mask when out in public, stay away from anyone that may not be feeling well and notify your surgeon if you develop symptoms. If you test positive for Covid or have been in contact with anyone that has tested positive in the last 10 days please notify you surgeon.  [facility] - Preparing for Surgery Before surgery, you can play an important role.  Because skin is not sterile, your skin needs to be as free of germs as possible.  You can reduce the number of germs on your skin by washing with CHG (chlorahexidine gluconate) soap before surgery.  CHG is an antiseptic cleaner which kills germs and bonds with the skin to continue killing germs even after washing. Please DO NOT use if you have an allergy to CHG or antibacterial soaps.  If your skin becomes reddened/irritated stop using the CHG and inform your nurse when you arrive at Short Stay. Do not shave (including legs and underarms) for at least 48 hours prior to the first CHG shower.  You may shave your face/neck.  Please follow these instructions carefully:  1.  Shower with CHG Soap the night before surgery and the  morning of surgery.  2.  If you choose to wash your hair, wash your hair first as usual  with your normal  shampoo.  3.  After you shampoo, rinse your hair and body thoroughly to remove the shampoo.                             4.  Use CHG as you would any other liquid soap.  You can apply chg directly to the skin and wash.  Gently with a scrungie or clean washcloth.  5.  Apply the CHG Soap to your body ONLY FROM THE NECK DOWN.   Do   not use on face/ open                           Wound or open sores. Avoid contact with eyes, ears mouth and   genitals (private parts).  Wash face,  Genitals (private parts) with your normal soap.             6.  Wash thoroughly, paying special attention to the area where your    surgery  will be performed.  7.  Thoroughly rinse your body with warm water from the neck down.  8.  DO NOT shower/wash with your normal soap after using and rinsing off the CHG Soap.                9.  Pat yourself dry with a clean towel.            10.  Wear clean pajamas.            11.  Place clean sheets on your bed the night of your first shower and do not  sleep with pets. Day of Surgery : Do not apply any lotions/deodorants the morning of surgery.  Please wear clean clothes to the hospital/surgery center.  FAILURE TO FOLLOW THESE INSTRUCTIONS MAY RESULT IN THE CANCELLATION OF YOUR SURGERY  PATIENT SIGNATURE_________________________________  NURSE SIGNATURE__________________________________  ________________________________________________________________________      Sarah Meadows  An incentive spirometer is a tool that can help keep your lungs clear and active. This tool measures how well you are filling your lungs with each breath. Taking long deep breaths may help reverse or decrease the chance of developing breathing (pulmonary) problems (especially infection) following: A long period of time when you are unable to move or be active. BEFORE THE PROCEDURE  If the spirometer includes an indicator to show your best effort, your  nurse or respiratory therapist will set it to a desired goal. If possible, sit up straight or lean slightly forward. Try not to slouch. Hold the incentive spirometer in an upright position. INSTRUCTIONS FOR USE  Sit on the edge of your bed if possible, or sit up as far as you can in bed or on a chair. Hold the incentive spirometer in an upright position. Breathe out normally. Place the mouthpiece in your mouth and seal your lips tightly around it. Breathe in slowly and as deeply as possible, raising the piston or the ball toward the top of the column. Hold your breath for 3-5 seconds or for as long as possible. Allow the piston or ball to fall to the bottom of the column. Remove the mouthpiece from your mouth and breathe out normally. Rest for a few seconds and repeat Steps 1 through 7 at least 10 times every 1-2 hours when you are awake. Take your time and take a few normal breaths between deep breaths. The spirometer may include an indicator to show your best effort. Use the indicator as a goal to work toward during each repetition. After each set of 10 deep breaths, practice coughing to be sure your lungs are clear. If you have an incision (the cut made at the time of surgery), support your incision when coughing by placing a pillow or rolled up towels firmly against it. Once you are able to get out of bed, walk around indoors and cough well. You may stop using the incentive spirometer when instructed by your caregiver.  RISKS AND COMPLICATIONS Take your time so you do not get dizzy or light-headed. If you are in pain, you may need to take or ask for pain medication before doing incentive spirometry. It is harder to take a deep breath if you are having pain. AFTER USE Rest and breathe slowly and easily. It can  be helpful to keep track of a log of your progress. Your caregiver can provide you with a simple table to help with this. If you are using the spirometer at home, follow these  instructions: SEEK MEDICAL CARE IF:  You are having difficultly using the spirometer. You have trouble using the spirometer as often as instructed. Your pain medication is not giving enough relief while using the spirometer. You develop fever of 100.5 F (38.1 C) or higher. SEEK IMMEDIATE MEDICAL CARE IF:  You cough up bloody sputum that had not been present before. You develop fever of 102 F (38.9 C) or greater. You develop worsening pain at or near the incision site. MAKE SURE YOU:  Understand these instructions. Will watch your condition. Will get help right away if you are not doing well or get worse. Document Released: 08/13/2006 Document Revised: 06/25/2011 Document Reviewed: 10/14/2006 [employer] Patient Information 2014 [employer], [place].   ________________________________________________________________________    WHAT IS A BLOOD TRANSFUSION? Blood Transfusion Information  A transfusion is the replacement of blood or some of its parts. Blood is made up of multiple cells which provide different functions. Red blood cells carry oxygen and are used for blood loss replacement. White blood cells fight against infection. Platelets control bleeding. Plasma helps clot blood. Other blood products are available for specialized needs, such as hemophilia or other clotting disorders. BEFORE THE TRANSFUSION  Who gives blood for transfusions?  Healthy volunteers who are fully evaluated to make sure their blood is safe. This is blood bank blood. Transfusion therapy is the safest it has ever been in the practice of medicine. Before blood is taken from a donor, a complete history is taken to make sure that person has no history of diseases nor engages in risky social behavior (examples are intravenous drug use or sexual activity with multiple partners). The donor's travel history is screened to minimize risk of transmitting infections, such as malaria. The donated blood is tested for signs of  infectious diseases, such as HIV and hepatitis. The blood is then tested to be sure it is compatible with you in order to minimize the chance of a transfusion reaction. If you or a relative donates blood, this is often done in anticipation of surgery and is not appropriate for emergency situations. It takes many days to process the donated blood. RISKS AND COMPLICATIONS Although transfusion therapy is very safe and saves many lives, the main dangers of transfusion include:  Getting an infectious disease. Developing a transfusion reaction. This is an allergic reaction to something in the blood you were given. Every precaution is taken to prevent this. The decision to have a blood transfusion has been considered carefully by your caregiver before blood is given. Blood is not given unless the benefits outweigh the risks. AFTER THE TRANSFUSION Right after receiving a blood transfusion, you will usually feel much better and more energetic. This is especially true if your red blood cells have gotten low (anemic). The transfusion raises the level of the red blood cells which carry oxygen, and this usually causes an energy increase. The nurse administering the transfusion will monitor you carefully for complications. HOME CARE INSTRUCTIONS  No special instructions are needed after a transfusion. You may find your energy is better. Speak with your caregiver about any limitations on activity for underlying diseases you may have. SEEK MEDICAL CARE IF:  Your condition is not improving after your transfusion. You develop redness or irritation at the intravenous (IV) site. SEEK IMMEDIATE MEDICAL CARE  IF:  Any of the following symptoms occur over the next 12 hours: Shaking chills. You have a temperature by mouth above 102 F (38.9 C), not controlled by medicine. Chest, back, or muscle pain. People around you feel you are not acting correctly or are confused. Shortness of breath or difficulty  breathing. Dizziness and fainting. You get a rash or develop hives. You have a decrease in urine output. Your urine turns a dark color or changes to pink, red, or brown. Any of the following symptoms occur over the next 10 days: You have a temperature by mouth above 102 F (38.9 C), not controlled by medicine. Shortness of breath. Weakness after normal activity. The white part of the eye turns yellow (jaundice). You have a decrease in the amount of urine or are urinating less often. Your urine turns a dark color or changes to pink, red, or brown. Document Released: 03/30/2000 Document Revised: 06/25/2011 Document Reviewed: 11/17/2007 [facility] Patient Information 2014 [place], [place].  _______________________________________________________________________

## 2023-03-08 ENCOUNTER — Other Ambulatory Visit: Payer: Self-pay

## 2023-03-08 ENCOUNTER — Encounter (HOSPITAL_COMMUNITY): Payer: Self-pay

## 2023-03-08 ENCOUNTER — Encounter (HOSPITAL_COMMUNITY)
Admission: RE | Admit: 2023-03-08 | Discharge: 2023-03-08 | Disposition: A | Discharge status: Home / Self Care | Payer: BC Managed Care – PPO | Source: Ambulatory Visit | Attending: Surgery | Admitting: Surgery

## 2023-03-08 VITALS — BP 139/56 | HR 80 | Temp 98.4°F | Resp 20 | Ht 61.0 in | Wt 152.4 lb

## 2023-03-08 DIAGNOSIS — E119 Type 2 diabetes mellitus without complications: Secondary | ICD-10-CM | POA: Insufficient documentation

## 2023-03-08 DIAGNOSIS — Z01818 Encounter for other preprocedural examination: Secondary | ICD-10-CM | POA: Insufficient documentation

## 2023-03-08 DIAGNOSIS — R739 Hyperglycemia, unspecified: Secondary | ICD-10-CM | POA: Insufficient documentation

## 2023-03-08 HISTORY — DX: Unspecified osteoarthritis, unspecified site: M19.90

## 2023-03-08 HISTORY — DX: Anemia, unspecified: D64.9

## 2023-03-08 HISTORY — DX: Headache, unspecified: R51.9

## 2023-03-08 LAB — CBC
HCT: 39.4 % (ref 36.0–46.0)
Hemoglobin: 13.6 g/dL (ref 12.0–15.0)
MCH: 31 pg (ref 26.0–34.0)
MCHC: 34.5 g/dL (ref 30.0–36.0)
MCV: 89.7 fL (ref 80.0–100.0)
Platelets: 253 10*3/uL (ref 150–400)
RBC: 4.39 MIL/uL (ref 3.87–5.11)
RDW: 12.7 % (ref 11.5–15.5)
WBC: 5.3 10*3/uL (ref 4.0–10.5)
nRBC: 0 % (ref 0.0–0.2)

## 2023-03-08 LAB — TYPE AND SCREEN
ABO/RH(D): A POS
Antibody Screen: NEGATIVE

## 2023-03-08 LAB — BASIC METABOLIC PANEL
Anion gap: 11 (ref 5–15)
BUN: 13 mg/dL (ref 6–20)
CO2: 24 mmol/L (ref 22–32)
Calcium: 9.6 mg/dL (ref 8.9–10.3)
Chloride: 105 mmol/L (ref 98–111)
Creatinine, Ser: 0.75 mg/dL (ref 0.44–1.00)
GFR, Estimated: >60 mL/min (ref >60)
Glucose, Bld: 80 mg/dL (ref 70–99)
Potassium: 3.9 mmol/L (ref 3.5–5.1)
Sodium: 140 mmol/L (ref 135–145)

## 2023-03-08 LAB — HEMOGLOBIN A1C
Hgb A1c MFr Bld: 5.5 % (ref 4.8–5.6)
Mean Plasma Glucose: 111.15 mg/dL

## 2023-03-08 LAB — GLUCOSE, CAPILLARY: Glucose-Capillary: 72 mg/dL (ref 70–99)

## 2023-03-08 NOTE — Consult Note (Signed)
WOC Nurse requested for preoperative stoma site marking  Discussed surgical procedure and stoma creation with patient.  Explained role of the WOC nurse team.  Provided the patient with educational booklet and provided samples of pouching options.  Answered patient questions.   Examined patient lying, sitting, and standing in order to place the marking in the patient's visual field, away from any creases or abdominal contour issues and within the rectus muscle. Patient has significant creasing upper abdomen when she stands.   Attempted to mark below the patient's belt line.   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  Marked for ileostomy in the RLQ 5 cm to the right of the umbilicus and  6.5 cm below the umbilicus.   Patient's abdomen cleansed with CHG wipes at site markings, allowed to air dry prior to marking.Covered mark with thin film transparent dressing to preserve mark until date of surgery. Patients surgery is scheduled for 03/20/2023. Did give patient marker and additional thin films to remake marks if needed.    WOC Nurse team will follow up with patient after surgery for continue ostomy care and teaching.   Thank you,    Priscella Mann MSN, RN-BC, [employer] [phone]

## 2023-03-20 ENCOUNTER — Inpatient Hospital Stay (HOSPITAL_COMMUNITY)
Admission: RE | Admit: 2023-03-20 | Discharge: 2023-03-22 | DRG: 386 | Disposition: A | Discharge status: Home / Self Care | Payer: BC Managed Care – PPO | Attending: Surgery | Admitting: Surgery

## 2023-03-20 ENCOUNTER — Encounter (HOSPITAL_COMMUNITY)
Admission: RE | Disposition: A | Discharge status: Home / Self Care | Payer: Self-pay | Source: Home / Self Care | Attending: Surgery

## 2023-03-20 ENCOUNTER — Inpatient Hospital Stay (HOSPITAL_COMMUNITY): Payer: Self-pay

## 2023-03-20 ENCOUNTER — Encounter (HOSPITAL_COMMUNITY): Payer: Self-pay | Admitting: Surgery

## 2023-03-20 ENCOUNTER — Inpatient Hospital Stay (HOSPITAL_COMMUNITY): Payer: BC Managed Care – PPO

## 2023-03-20 ENCOUNTER — Other Ambulatory Visit: Payer: Self-pay

## 2023-03-20 DIAGNOSIS — Z886 Allergy status to analgesic agent status: Secondary | ICD-10-CM | POA: Diagnosis not present

## 2023-03-20 DIAGNOSIS — K5090 Crohn's disease, unspecified, without complications: Secondary | ICD-10-CM | POA: Diagnosis present

## 2023-03-20 DIAGNOSIS — Z821 Family history of blindness and visual loss: Secondary | ICD-10-CM | POA: Diagnosis not present

## 2023-03-20 DIAGNOSIS — E785 Hyperlipidemia, unspecified: Secondary | ICD-10-CM | POA: Diagnosis present

## 2023-03-20 DIAGNOSIS — Z7960 Long term (current) use of unspecified immunomodulators and immunosuppressants: Secondary | ICD-10-CM | POA: Diagnosis not present

## 2023-03-20 DIAGNOSIS — K633 Ulcer of intestine: Secondary | ICD-10-CM | POA: Diagnosis present

## 2023-03-20 DIAGNOSIS — K50012 Crohn's disease of small intestine with intestinal obstruction: Secondary | ICD-10-CM | POA: Diagnosis present

## 2023-03-20 DIAGNOSIS — K660 Peritoneal adhesions (postprocedural) (postinfection): Secondary | ICD-10-CM | POA: Diagnosis present

## 2023-03-20 DIAGNOSIS — Z888 Allergy status to other drugs, medicaments and biological substances status: Secondary | ICD-10-CM

## 2023-03-20 DIAGNOSIS — K56699 Other intestinal obstruction unspecified as to partial versus complete obstruction: Secondary | ICD-10-CM | POA: Diagnosis present

## 2023-03-20 DIAGNOSIS — E66811 Obesity, class 1: Secondary | ICD-10-CM | POA: Diagnosis present

## 2023-03-20 DIAGNOSIS — K5730 Diverticulosis of large intestine without perforation or abscess without bleeding: Secondary | ICD-10-CM | POA: Diagnosis present

## 2023-03-20 DIAGNOSIS — Z8249 Family history of ischemic heart disease and other diseases of the circulatory system: Secondary | ICD-10-CM

## 2023-03-20 DIAGNOSIS — F411 Generalized anxiety disorder: Secondary | ICD-10-CM | POA: Diagnosis present

## 2023-03-20 DIAGNOSIS — Z833 Family history of diabetes mellitus: Secondary | ICD-10-CM

## 2023-03-20 DIAGNOSIS — D84821 Immunodeficiency due to drugs: Secondary | ICD-10-CM | POA: Diagnosis present

## 2023-03-20 DIAGNOSIS — L439 Lichen planus, unspecified: Secondary | ICD-10-CM | POA: Diagnosis present

## 2023-03-20 DIAGNOSIS — Z79899 Other long term (current) drug therapy: Secondary | ICD-10-CM

## 2023-03-20 DIAGNOSIS — E119 Type 2 diabetes mellitus without complications: Secondary | ICD-10-CM | POA: Diagnosis present

## 2023-03-20 DIAGNOSIS — Z6832 Body mass index (BMI) 32.0-32.9, adult: Secondary | ICD-10-CM | POA: Diagnosis not present

## 2023-03-20 DIAGNOSIS — G2581 Restless legs syndrome: Secondary | ICD-10-CM | POA: Diagnosis present

## 2023-03-20 DIAGNOSIS — E669 Obesity, unspecified: Secondary | ICD-10-CM | POA: Diagnosis present

## 2023-03-20 DIAGNOSIS — Z7985 Long-term (current) use of injectable non-insulin antidiabetic drugs: Secondary | ICD-10-CM | POA: Diagnosis not present

## 2023-03-20 LAB — GLUCOSE, CAPILLARY
Glucose-Capillary: 82 mg/dL (ref 70–99)
Glucose-Capillary: 133 mg/dL — ABNORMAL HIGH (ref 70–99)

## 2023-03-20 LAB — ABO/RH: ABO/RH(D): A POS

## 2023-03-20 SURGERY — COLECTOMY, PARTIAL, ROBOT-ASSISTED, LAPAROSCOPIC
Anesthesia: General

## 2023-03-20 MED ORDER — CHLORHEXIDINE GLUCONATE 0.12 % MT SOLN
15.0000 mL | Freq: Once | OROMUCOSAL | Status: AC
  Administered 2023-03-20: 15 mL via OROMUCOSAL

## 2023-03-20 MED ORDER — DEXMEDETOMIDINE HCL IN NACL 80 MCG/20ML IV SOLN
INTRAVENOUS | Status: DC | PRN
  Administered 2023-03-20: 8 ug via INTRAVENOUS
  Administered 2023-03-20: 4 ug via INTRAVENOUS

## 2023-03-20 MED ORDER — ENOXAPARIN SODIUM 40 MG/0.4ML IJ SOSY
40.0000 mg | PREFILLED_SYRINGE | INTRAMUSCULAR | Status: DC
  Administered 2023-03-21 – 2023-03-22 (×2): 40 mg via SUBCUTANEOUS
  Filled 2023-03-20 (×2): qty 0.4

## 2023-03-20 MED ORDER — HYDRALAZINE HCL 20 MG/ML IJ SOLN: 10.0000 mg | INTRAMUSCULAR | Status: DC | PRN

## 2023-03-20 MED ORDER — ACETAMINOPHEN 10 MG/ML IV SOLN
INTRAVENOUS | Status: DC | PRN
  Administered 2023-03-20: 1000 mg via INTRAVENOUS

## 2023-03-20 MED ORDER — SODIUM CHLORIDE 0.9% FLUSH: 3.0000 mL | INTRAVENOUS | Status: DC | PRN

## 2023-03-20 MED ORDER — LIDOCAINE HCL (CARDIAC) PF 100 MG/5ML IV SOSY
PREFILLED_SYRINGE | INTRAVENOUS | Status: DC | PRN
  Administered 2023-03-20: 100 mg via INTRAVENOUS

## 2023-03-20 MED ORDER — INSULIN ASPART 100 UNIT/ML IJ SOLN: 0.0000 [IU] | INTRAMUSCULAR | Status: DC | PRN

## 2023-03-20 MED ORDER — ENSURE PRE-SURGERY PO LIQD: 592.0000 mL | Freq: Once | ORAL | Status: DC

## 2023-03-20 MED ORDER — BUPIVACAINE-EPINEPHRINE (PF) 0.25% -1:200000 IJ SOLN
INTRAMUSCULAR | Status: DC | PRN
  Administered 2023-03-20: 70 mL

## 2023-03-20 MED ORDER — 0.9 % SODIUM CHLORIDE (POUR BTL) OPTIME
TOPICAL | Status: DC | PRN
  Administered 2023-03-20: 1000 mL

## 2023-03-20 MED ORDER — ALBUMIN HUMAN 5 % IV SOLN: INTRAVENOUS | Status: DC | PRN

## 2023-03-20 MED ORDER — SIMETHICONE 80 MG PO CHEW
40.0000 mg | CHEWABLE_TABLET | Freq: Four times a day (QID) | ORAL | Status: DC | PRN
  Administered 2023-03-21: 40 mg via ORAL
  Filled 2023-03-20: qty 1

## 2023-03-20 MED ORDER — FENTANYL CITRATE (PF) 100 MCG/2ML IJ SOLN
INTRAMUSCULAR | Status: DC | PRN
  Administered 2023-03-20 (×2): 50 ug via INTRAVENOUS

## 2023-03-20 MED ORDER — PHENYLEPHRINE 80 MCG/ML (10ML) SYRINGE FOR IV PUSH (FOR BLOOD PRESSURE SUPPORT)
PREFILLED_SYRINGE | INTRAVENOUS | Status: AC
  Filled 2023-03-20: qty 10

## 2023-03-20 MED ORDER — DICYCLOMINE HCL 10 MG PO CAPS: 10.0000 mg | ORAL_CAPSULE | Freq: Three times a day (TID) | ORAL | Status: DC | PRN

## 2023-03-20 MED ORDER — ACETAMINOPHEN 10 MG/ML IV SOLN
INTRAVENOUS | Status: AC
  Filled 2023-03-20: qty 100

## 2023-03-20 MED ORDER — LACTATED RINGERS IV SOLN: Freq: Three times a day (TID) | INTRAVENOUS | Status: DC | PRN

## 2023-03-20 MED ORDER — FENTANYL CITRATE PF 50 MCG/ML IJ SOSY
PREFILLED_SYRINGE | INTRAMUSCULAR | Status: AC
  Administered 2023-03-20: 50 ug via INTRAVENOUS
  Filled 2023-03-20: qty 2

## 2023-03-20 MED ORDER — ENOXAPARIN SODIUM 40 MG/0.4ML IJ SOSY
40.0000 mg | PREFILLED_SYRINGE | Freq: Once | INTRAMUSCULAR | Status: AC
  Administered 2023-03-20: 40 mg via SUBCUTANEOUS
  Filled 2023-03-20: qty 0.4

## 2023-03-20 MED ORDER — ONDANSETRON HCL 4 MG/2ML IJ SOLN
INTRAMUSCULAR | Status: DC | PRN
  Administered 2023-03-20: 4 mg via INTRAVENOUS

## 2023-03-20 MED ORDER — PHENYLEPHRINE HCL (PRESSORS) 10 MG/ML IV SOLN
INTRAVENOUS | Status: DC | PRN
  Administered 2023-03-20 (×4): 80 ug via INTRAVENOUS
  Administered 2023-03-20: 120 ug via INTRAVENOUS

## 2023-03-20 MED ORDER — ROCURONIUM BROMIDE 10 MG/ML (PF) SYRINGE
PREFILLED_SYRINGE | INTRAVENOUS | Status: AC
  Filled 2023-03-20: qty 10

## 2023-03-20 MED ORDER — ONDANSETRON HCL 4 MG/2ML IJ SOLN: 4.0000 mg | Freq: Four times a day (QID) | INTRAMUSCULAR | Status: DC | PRN

## 2023-03-20 MED ORDER — KCL IN DEXTROSE-NACL 20-5-0.45 MEQ/L-%-% IV SOLN
INTRAVENOUS | Status: DC
  Filled 2023-03-20: qty 1000

## 2023-03-20 MED ORDER — ALVIMOPAN 12 MG PO CAPS
12.0000 mg | ORAL_CAPSULE | Freq: Two times a day (BID) | ORAL | Status: DC
  Administered 2023-03-21: 12 mg via ORAL
  Filled 2023-03-20 (×2): qty 1

## 2023-03-20 MED ORDER — DIPHENHYDRAMINE HCL 12.5 MG/5ML PO ELIX: 12.5000 mg | ORAL_SOLUTION | Freq: Four times a day (QID) | ORAL | Status: DC | PRN

## 2023-03-20 MED ORDER — MAGIC MOUTHWASH: 15.0000 mL | Freq: Four times a day (QID) | ORAL | Status: DC | PRN

## 2023-03-20 MED ORDER — POLYETHYLENE GLYCOL 3350 17 GM/SCOOP PO POWD: 1.0000 | Freq: Once | ORAL | Status: DC

## 2023-03-20 MED ORDER — KETAMINE HCL 50 MG/5ML IJ SOSY
PREFILLED_SYRINGE | INTRAMUSCULAR | Status: AC
  Filled 2023-03-20: qty 5

## 2023-03-20 MED ORDER — METHOCARBAMOL 500 MG PO TABS
1000.0000 mg | ORAL_TABLET | Freq: Four times a day (QID) | ORAL | Status: DC | PRN
  Administered 2023-03-20: 1000 mg via ORAL
  Filled 2023-03-20: qty 2

## 2023-03-20 MED ORDER — KETAMINE HCL 10 MG/ML IJ SOLN
INTRAMUSCULAR | Status: DC | PRN
  Administered 2023-03-20: 50 mg via INTRAVENOUS

## 2023-03-20 MED ORDER — ALUM & MAG HYDROXIDE-SIMETH 200-200-20 MG/5ML PO SUSP: 30.0000 mL | Freq: Four times a day (QID) | ORAL | Status: DC | PRN

## 2023-03-20 MED ORDER — SALINE SPRAY 0.65 % NA SOLN: 1.0000 | Freq: Four times a day (QID) | NASAL | Status: DC | PRN

## 2023-03-20 MED ORDER — MELATONIN 3 MG PO TABS
3.0000 mg | ORAL_TABLET | Freq: Every evening | ORAL | Status: DC | PRN
  Administered 2023-03-20: 3 mg via ORAL
  Filled 2023-03-20: qty 1

## 2023-03-20 MED ORDER — NEOMYCIN SULFATE 500 MG PO TABS: 1000.0000 mg | ORAL_TABLET | ORAL | Status: DC

## 2023-03-20 MED ORDER — DIPHENHYDRAMINE HCL 50 MG/ML IJ SOLN: 12.5000 mg | Freq: Four times a day (QID) | INTRAMUSCULAR | Status: DC | PRN

## 2023-03-20 MED ORDER — ORAL CARE MOUTH RINSE: 15.0000 mL | Freq: Once | OROMUCOSAL | Status: AC

## 2023-03-20 MED ORDER — SUMATRIPTAN SUCCINATE 50 MG PO TABS: 100.0000 mg | ORAL_TABLET | ORAL | Status: DC | PRN

## 2023-03-20 MED ORDER — NAPHAZOLINE-GLYCERIN 0.012-0.25 % OP SOLN: 1.0000 [drp] | Freq: Four times a day (QID) | OPHTHALMIC | Status: DC | PRN

## 2023-03-20 MED ORDER — TRAMADOL HCL 50 MG PO TABS
50.0000 mg | ORAL_TABLET | Freq: Four times a day (QID) | ORAL | Status: DC | PRN
  Administered 2023-03-20 – 2023-03-22 (×4): 50 mg via ORAL
  Filled 2023-03-20 (×4): qty 1

## 2023-03-20 MED ORDER — ROCURONIUM BROMIDE 100 MG/10ML IV SOLN
INTRAVENOUS | Status: DC | PRN
  Administered 2023-03-20: 20 mg via INTRAVENOUS
  Administered 2023-03-20: 50 mg via INTRAVENOUS

## 2023-03-20 MED ORDER — HYDROMORPHONE HCL 1 MG/ML IJ SOLN
0.5000 mg | INTRAMUSCULAR | Status: DC | PRN
  Administered 2023-03-20: 1 mg via INTRAVENOUS
  Filled 2023-03-20 (×2): qty 1

## 2023-03-20 MED ORDER — OXYCODONE HCL 5 MG PO TABS
5.0000 mg | ORAL_TABLET | ORAL | Status: AC | PRN
Start: 2023-03-20 — End: 2023-03-20
  Administered 2023-03-20: 5 mg via ORAL

## 2023-03-20 MED ORDER — DEXAMETHASONE SODIUM PHOSPHATE 10 MG/ML IJ SOLN
INTRAMUSCULAR | Status: AC
  Filled 2023-03-20: qty 1

## 2023-03-20 MED ORDER — SODIUM CHLORIDE 0.9 % IV SOLN: 250.0000 mL | INTRAVENOUS | Status: DC | PRN

## 2023-03-20 MED ORDER — BUPIVACAINE-EPINEPHRINE 0.25% -1:200000 IJ SOLN
INTRAMUSCULAR | Status: AC
  Filled 2023-03-20: qty 1

## 2023-03-20 MED ORDER — DEXMEDETOMIDINE HCL IN NACL 80 MCG/20ML IV SOLN
INTRAVENOUS | Status: AC
  Filled 2023-03-20: qty 20

## 2023-03-20 MED ORDER — MIDAZOLAM HCL 2 MG/2ML IJ SOLN
INTRAMUSCULAR | Status: AC
  Filled 2023-03-20: qty 2

## 2023-03-20 MED ORDER — PROCHLORPERAZINE EDISYLATE 10 MG/2ML IJ SOLN
5.0000 mg | Freq: Four times a day (QID) | INTRAMUSCULAR | Status: DC | PRN
Start: 2023-03-20 — End: 2023-03-22

## 2023-03-20 MED ORDER — ALBUMIN HUMAN 5 % IV SOLN
INTRAVENOUS | Status: AC
  Filled 2023-03-20: qty 250

## 2023-03-20 MED ORDER — LIDOCAINE HCL (PF) 2 % IJ SOLN
INTRAMUSCULAR | Status: AC
  Filled 2023-03-20: qty 5

## 2023-03-20 MED ORDER — PROPOFOL 10 MG/ML IV BOLUS
INTRAVENOUS | Status: AC
  Filled 2023-03-20: qty 20

## 2023-03-20 MED ORDER — LACTATED RINGERS IV SOLN: INTRAVENOUS | Status: DC | PRN

## 2023-03-20 MED ORDER — ADULT MULTIVITAMIN W/MINERALS CH
1.0000 | ORAL_TABLET | Freq: Every day | ORAL | Status: DC
  Administered 2023-03-20 – 2023-03-22 (×3): 1 via ORAL
  Filled 2023-03-20 (×3): qty 1

## 2023-03-20 MED ORDER — MENTHOL 3 MG MT LOZG: 1.0000 | LOZENGE | OROMUCOSAL | Status: DC | PRN

## 2023-03-20 MED ORDER — ACETAMINOPHEN 500 MG PO TABS
1000.0000 mg | ORAL_TABLET | Freq: Four times a day (QID) | ORAL | Status: DC
  Administered 2023-03-20 – 2023-03-22 (×7): 1000 mg via ORAL
  Filled 2023-03-20 (×7): qty 2

## 2023-03-20 MED ORDER — CEFOTETAN DISODIUM 2 G IJ SOLR
2.0000 g | INTRAMUSCULAR | Status: AC
  Administered 2023-03-20: 2 g via INTRAVENOUS
  Filled 2023-03-20: qty 2

## 2023-03-20 MED ORDER — PHENYLEPHRINE HCL-NACL 20-0.9 MG/250ML-% IV SOLN
INTRAVENOUS | Status: DC | PRN
  Administered 2023-03-20: 50 ug/min via INTRAVENOUS

## 2023-03-20 MED ORDER — DEXAMETHASONE SODIUM PHOSPHATE 10 MG/ML IJ SOLN
INTRAMUSCULAR | Status: DC | PRN
  Administered 2023-03-20: 10 mg via INTRAVENOUS

## 2023-03-20 MED ORDER — PHENOL 1.4 % MT LIQD: 2.0000 | OROMUCOSAL | Status: DC | PRN

## 2023-03-20 MED ORDER — ALVIMOPAN 12 MG PO CAPS
12.0000 mg | ORAL_CAPSULE | ORAL | Status: AC
Start: 2023-03-20 — End: 2023-03-20
  Administered 2023-03-20: 12 mg via ORAL
  Filled 2023-03-20: qty 1

## 2023-03-20 MED ORDER — METRONIDAZOLE 500 MG PO TABS: 1000.0000 mg | ORAL_TABLET | ORAL | Status: DC

## 2023-03-20 MED ORDER — GABAPENTIN 100 MG PO CAPS
200.0000 mg | ORAL_CAPSULE | Freq: Every day | ORAL | Status: DC
Start: 2023-03-20 — End: 2023-03-22
  Administered 2023-03-20 – 2023-03-21 (×2): 200 mg via ORAL
  Filled 2023-03-20 (×2): qty 2

## 2023-03-20 MED ORDER — BISACODYL 5 MG PO TBEC: 20.0000 mg | DELAYED_RELEASE_TABLET | Freq: Once | ORAL | Status: DC

## 2023-03-20 MED ORDER — MIDAZOLAM HCL 5 MG/5ML IJ SOLN
INTRAMUSCULAR | Status: DC | PRN
  Administered 2023-03-20: 2 mg via INTRAVENOUS

## 2023-03-20 MED ORDER — FENTANYL CITRATE PF 50 MCG/ML IJ SOSY
25.0000 ug | PREFILLED_SYRINGE | INTRAMUSCULAR | Status: DC | PRN
  Administered 2023-03-20: 50 ug via INTRAVENOUS

## 2023-03-20 MED ORDER — SODIUM CHLORIDE 0.9% FLUSH
3.0000 mL | Freq: Two times a day (BID) | INTRAVENOUS | Status: DC
  Administered 2023-03-20 – 2023-03-21 (×4): 3 mL via INTRAVENOUS

## 2023-03-20 MED ORDER — ONDANSETRON HCL 4 MG PO TABS
4.0000 mg | ORAL_TABLET | Freq: Four times a day (QID) | ORAL | Status: DC | PRN
Start: 2023-03-20 — End: 2023-03-22

## 2023-03-20 MED ORDER — AMISULPRIDE (ANTIEMETIC) 5 MG/2ML IV SOLN: 10.0000 mg | Freq: Once | INTRAVENOUS | Status: DC | PRN

## 2023-03-20 MED ORDER — PROCHLORPERAZINE MALEATE 10 MG PO TABS: 10.0000 mg | ORAL_TABLET | Freq: Four times a day (QID) | ORAL | Status: DC | PRN

## 2023-03-20 MED ORDER — SUGAMMADEX SODIUM 200 MG/2ML IV SOLN
INTRAVENOUS | Status: DC | PRN
  Administered 2023-03-20: 200 mg via INTRAVENOUS

## 2023-03-20 MED ORDER — CALCIUM POLYCARBOPHIL 625 MG PO TABS
625.0000 mg | ORAL_TABLET | Freq: Two times a day (BID) | ORAL | Status: DC
  Administered 2023-03-20 – 2023-03-22 (×5): 625 mg via ORAL
  Filled 2023-03-20 (×5): qty 1

## 2023-03-20 MED ORDER — OXYCODONE HCL 5 MG PO TABS
ORAL_TABLET | ORAL | Status: AC
  Filled 2023-03-20: qty 1

## 2023-03-20 MED ORDER — BISMUTH SUBSALICYLATE 262 MG/15ML PO SUSP: 30.0000 mL | Freq: Three times a day (TID) | ORAL | Status: DC | PRN

## 2023-03-20 MED ORDER — PROPOFOL 10 MG/ML IV BOLUS
INTRAVENOUS | Status: DC | PRN
  Administered 2023-03-20: 180 mg via INTRAVENOUS

## 2023-03-20 MED ORDER — ONDANSETRON HCL 4 MG/2ML IJ SOLN
INTRAMUSCULAR | Status: AC
  Filled 2023-03-20: qty 2

## 2023-03-20 MED ORDER — METHOCARBAMOL 1000 MG/10ML IJ SOLN: 1000.0000 mg | Freq: Four times a day (QID) | INTRAMUSCULAR | Status: DC | PRN

## 2023-03-20 MED ORDER — ENSURE SURGERY PO LIQD
237.0000 mL | Freq: Two times a day (BID) | ORAL | Status: DC
  Administered 2023-03-21: 237 mL via ORAL

## 2023-03-20 MED ORDER — BUPIVACAINE LIPOSOME 1.3 % IJ SUSP
INTRAMUSCULAR | Status: AC
  Filled 2023-03-20: qty 20

## 2023-03-20 MED ORDER — METOPROLOL TARTRATE 5 MG/5ML IV SOLN: 5.0000 mg | Freq: Four times a day (QID) | INTRAVENOUS | Status: DC | PRN

## 2023-03-20 MED ORDER — SODIUM CHLORIDE 0.9 % IV SOLN
2.0000 g | Freq: Two times a day (BID) | INTRAVENOUS | Status: AC
  Administered 2023-03-20: 2 g via INTRAVENOUS
  Filled 2023-03-20: qty 2

## 2023-03-20 MED ORDER — FENTANYL CITRATE (PF) 100 MCG/2ML IJ SOLN
INTRAMUSCULAR | Status: AC
  Filled 2023-03-20: qty 2

## 2023-03-20 MED ORDER — ENSURE PRE-SURGERY PO LIQD: 296.0000 mL | Freq: Once | ORAL | Status: DC

## 2023-03-20 SURGICAL SUPPLY — 96 items
APPLIER CLIP 5 13 M/L LIGAMAX5 (MISCELLANEOUS)
APPLIER CLIP ROT 10 11.4 M/L (STAPLE)
BAG COUNTER SPONGE SURGICOUNT (BAG) ×1 IMPLANT
BLADE EXTENDED COATED 6.5IN (ELECTRODE) IMPLANT
CANNULA REDUCER 12-8 DVNC XI (CANNULA) IMPLANT
CHLORAPREP W/TINT 26 (MISCELLANEOUS) IMPLANT
CLIP APPLIE 5 13 M/L LIGAMAX5 (MISCELLANEOUS) IMPLANT
CLIP APPLIE ROT 10 11.4 M/L (STAPLE) IMPLANT
COVER SURGICAL LIGHT HANDLE (MISCELLANEOUS) ×2 IMPLANT
COVER TIP SHEARS 8 DVNC (MISCELLANEOUS) ×1 IMPLANT
DEVICE TROCAR PUNCTURE CLOSURE (ENDOMECHANICALS) IMPLANT
DRAIN CHANNEL 19F RND (DRAIN) IMPLANT
DRAPE ARM DVNC X/XI (DISPOSABLE) ×4 IMPLANT
DRAPE COLUMN DVNC XI (DISPOSABLE) ×1 IMPLANT
DRAPE SURG IRRIG POUCH 19X23 (DRAPES) ×1 IMPLANT
DRIVER NDL LRG 8 DVNC XI (INSTRUMENTS) ×1 IMPLANT
DRIVER NDL MEGA SUTCUT DVNCXI (INSTRUMENTS) IMPLANT
DRIVER NDLE LRG 8 DVNC XI (INSTRUMENTS)
DRIVER NDLE MEGA SUTCUT DVNCXI (INSTRUMENTS) ×1
DRSG OPSITE POSTOP 4X10 (GAUZE/BANDAGES/DRESSINGS) IMPLANT
DRSG OPSITE POSTOP 4X6 (GAUZE/BANDAGES/DRESSINGS) IMPLANT
DRSG OPSITE POSTOP 4X8 (GAUZE/BANDAGES/DRESSINGS) IMPLANT
DRSG TEGADERM 2-3/8X2-3/4 SM (GAUZE/BANDAGES/DRESSINGS) ×5 IMPLANT
DRSG TEGADERM 4X4.75 (GAUZE/BANDAGES/DRESSINGS) IMPLANT
ELECT PENCIL ROCKER SW 15FT (MISCELLANEOUS) ×1 IMPLANT
ELECT REM PT RETURN 15FT ADLT (MISCELLANEOUS) ×1 IMPLANT
ENDOLOOP SUT PDS II 0 18 (SUTURE) IMPLANT
EVACUATOR SILICONE 100CC (DRAIN) IMPLANT
FORCEPS BPLR FENES DVNC XI (FORCEP) IMPLANT
GAUZE SPONGE 2X2 8PLY STRL LF (GAUZE/BANDAGES/DRESSINGS) ×1 IMPLANT
GLOVE ECLIPSE 8.0 STRL XLNG CF (GLOVE) ×3 IMPLANT
GLOVE INDICATOR 8.0 STRL GRN (GLOVE) ×3 IMPLANT
GOWN SRG XL LVL 4 BRTHBL STRL (GOWNS) ×1 IMPLANT
GOWN STRL REUS W/ TWL XL LVL3 (GOWN DISPOSABLE) ×4 IMPLANT
GRASPER SUT TROCAR 14GX15 (MISCELLANEOUS) IMPLANT
GRASPER TIP-UP FEN DVNC XI (INSTRUMENTS) ×1 IMPLANT
HOLDER FOLEY CATH W/STRAP (MISCELLANEOUS) ×1 IMPLANT
IRRIG SUCT STRYKERFLOW 2 WTIP (MISCELLANEOUS) ×1
IRRIGATION SUCT STRKRFLW 2 WTP (MISCELLANEOUS) ×1 IMPLANT
KIT PROCEDURE DVNC SI (MISCELLANEOUS) IMPLANT
KIT SIGMOIDOSCOPE (SET/KITS/TRAYS/PACK) IMPLANT
KIT TURNOVER KIT A (KITS) IMPLANT
NDL INSUFFLATION 14GA 120MM (NEEDLE) ×1 IMPLANT
NEEDLE INSUFFLATION 14GA 120MM (NEEDLE) ×1
PACK CARDIOVASCULAR III (CUSTOM PROCEDURE TRAY) ×1 IMPLANT
PACK COLON (CUSTOM PROCEDURE TRAY) ×1 IMPLANT
PAD POSITIONING PINK XL (MISCELLANEOUS) ×1 IMPLANT
PROTECTOR NERVE ULNAR (MISCELLANEOUS) ×2 IMPLANT
RELOAD STAPLE 45 3.5 BLU DVNC (STAPLE) IMPLANT
RELOAD STAPLE 45 4.3 GRN DVNC (STAPLE) IMPLANT
RELOAD STAPLE 60 2.5 WHT DVNC (STAPLE) IMPLANT
RELOAD STAPLE 60 3.5 BLU DVNC (STAPLE) IMPLANT
RELOAD STAPLE 60 4.3 GRN DVNC (STAPLE) IMPLANT
RETRACTOR WND ALEXIS 18 MED (MISCELLANEOUS) IMPLANT
RTRCTR WOUND ALEXIS 18CM MED (MISCELLANEOUS) ×1
SCISSORS LAP 5X35 DISP (ENDOMECHANICALS) ×1 IMPLANT
SCISSORS MNPLR CVD DVNC XI (INSTRUMENTS) ×1 IMPLANT
SEAL UNIV 5-12 XI (MISCELLANEOUS) ×4 IMPLANT
SEALER VESSEL EXT DVNC XI (MISCELLANEOUS) ×1 IMPLANT
SOL ELECTROSURG ANTI STICK (MISCELLANEOUS) ×1
SOLUTION ELECTROSURG ANTI STCK (MISCELLANEOUS) ×1 IMPLANT
SPIKE FLUID TRANSFER (MISCELLANEOUS) ×1 IMPLANT
STAPLER 45 SUREFORM DVNC (STAPLE) IMPLANT
STAPLER 60 SUREFORM DVNC (STAPLE) IMPLANT
STAPLER ECHELON POWER CIR 29 (STAPLE) IMPLANT
STAPLER ECHELON POWER CIR 31 (STAPLE) IMPLANT
STAPLER RELOAD 2.5X60 WHT DVNC (STAPLE) ×1
STAPLER RELOAD 3.5X45 BLU DVNC (STAPLE)
STAPLER RELOAD 3.5X60 BLU DVNC (STAPLE) ×3
STAPLER RELOAD 4.3X45 GRN DVNC (STAPLE)
STAPLER RELOAD 4.3X60 GRN DVNC (STAPLE)
STOPCOCK 4 WAY LG BORE MALE ST (IV SETS) ×2 IMPLANT
SURGILUBE 2OZ TUBE FLIPTOP (MISCELLANEOUS) IMPLANT
SUT MNCRL AB 4-0 PS2 18 (SUTURE) ×1 IMPLANT
SUT PDS AB 1 CT1 27 (SUTURE) ×2 IMPLANT
SUT PROLENE 0 CT 2 (SUTURE) IMPLANT
SUT PROLENE 2 0 KS (SUTURE) IMPLANT
SUT PROLENE 2 0 SH DA (SUTURE) IMPLANT
SUT SILK 2 0 SH CR/8 (SUTURE) IMPLANT
SUT SILK 3 0 SH CR/8 (SUTURE) ×1 IMPLANT
SUT V-LOC BARB 180 2/0GR6 GS22 (SUTURE) ×2
SUT VIC AB 3-0 SH 18 (SUTURE) IMPLANT
SUT VIC AB 3-0 SH 27XBRD (SUTURE) IMPLANT
SUT VICRYL 0 UR6 27IN ABS (SUTURE) IMPLANT
SUTURE V-LC BRB 180 2/0GR6GS22 (SUTURE) IMPLANT
SYR 20ML ECCENTRIC (SYRINGE) ×1 IMPLANT
SYS LAPSCP GELPORT 120MM (MISCELLANEOUS)
SYS WOUND ALEXIS 18CM MED (MISCELLANEOUS) ×1
SYSTEM LAPSCP GELPORT 120MM (MISCELLANEOUS) IMPLANT
SYSTEM WOUND ALEXIS 18CM MED (MISCELLANEOUS) ×1 IMPLANT
TOWEL OR NON WOVEN STRL DISP B (DISPOSABLE) ×1 IMPLANT
TRAY FOLEY MTR SLVR 16FR STAT (SET/KITS/TRAYS/PACK) ×1 IMPLANT
TRAY FOLEY SLVR 14FR TEMP STAT (SET/KITS/TRAYS/PACK) IMPLANT
TROCAR ADV FIXATION 5X100MM (TROCAR) ×1 IMPLANT
TUBING CONNECTING 10 (TUBING) ×2 IMPLANT
TUBING INSUFFLATION 10FT LAP (TUBING) ×1 IMPLANT

## 2023-03-20 NOTE — Discharge Instructions (Addendum)
SURGERY: POST OP INSTRUCTIONS (Surgery for small bowel obstruction, colon resection, etc)   ######################################################################  EAT Gradually transition to a high fiber diet with a fiber supplement over the next few days after discharge  WALK Walk an hour a day.  Control your pain to do that.    CONTROL PAIN Control pain so that you can walk, sleep, tolerate sneezing/coughing, go up/down stairs.  HAVE A BOWEL MOVEMENT DAILY Keep your bowels regular to avoid problems.  OK to try a laxative to override constipation.  OK to use an antidairrheal to slow down diarrhea.  Call if not better after 2 tries  CALL IF YOU HAVE PROBLEMS/CONCERNS Call if you are still struggling despite following these instructions. Call if you have concerns not answered by these instructions  ######################################################################   DIET Follow a light diet the first few days at home.  Start with a bland diet such as soups, liquids, starchy foods, low fat foods, etc.  If you feel full, bloated, or constipated, stay on a ful liquid or pureed/blenderized diet for a few days until you feel better and no longer constipated. Be sure to drink plenty of fluids every day to avoid getting dehydrated (feeling dizzy, not urinating, etc.). Gradually add a fiber supplement to your diet over the next week.  Gradually get back to a regular solid diet.  Avoid fast food or heavy meals the first week as you are more likely to get nauseated. It is expected for your digestive tract to need a few months to get back to normal.  It is common for your bowel movements and stools to be irregular.  You will have occasional bloating and cramping that should eventually fade away.  Until you are eating solid food normally, off all pain medications, and back to regular activities; your bowels will not be normal. Focus on eating a low-fat, high fiber diet the rest of your life  (See Getting to Good Bowel Health, below).  CARE of your INCISION or WOUND  It is good for closed incisions and even open wounds to be washed every day.  Shower every day.  Short baths are fine.  Wash the incisions and wounds clean with soap & water.    You may leave closed incisions open to air if it is dry.   You may cover the incision with clean gauze & replace it after your daily shower for comfort.  TEGADERM:  You have clear gauze band-aid dressings over your closed incision(s).  Remove the dressings 3 days after surgery.    If you have an open wound with a wound vac, see wound vac care instructions.    ACTIVITIES as tolerated Start light daily activities --- self-care, walking, climbing stairs-- beginning the day after surgery.  Gradually increase activities as tolerated.  Control your pain to be active.  Stop when you are tired.  Ideally, walk several times a day, eventually an hour a day.   Most people are back to most day-to-day activities in a few weeks.  It takes 4-8 weeks to get back to unrestricted, intense activity. If you can walk 30 minutes without difficulty, it is safe to try more intense activity such as jogging, treadmill, bicycling, low-impact aerobics, swimming, etc. Save the most intensive and strenuous activity for last (Usually 4-8 weeks after surgery) such as sit-ups, heavy lifting, contact sports, etc.  Refrain from any intense heavy lifting or straining until you are off narcotics for pain control.  You will have off days, but  things should improve week-by-week. DO NOT PUSH THROUGH PAIN.  Let pain be your guide: If it hurts to do something, don't do it.  Pain is your body warning you to avoid that activity for another week until the pain goes down. You may drive when you are no longer taking narcotic prescription pain medication, you can comfortably wear a seatbelt, and you can safely make sudden turns/stops to protect yourself without hesitating due to pain. You may  have sexual intercourse when it is comfortable. If it hurts to do something, stop.   MEDICATIONS Take your usually prescribed home medications unless otherwise directed.    Restart your Cristy Folks immunosuppression 3 weeks after surgery at the soonest.  Blood thinners:  You can restart any strong blood thinners after the second postoperative day  for example: COUMADIN (warfarin), XERELTO (rivaroxaban), ELIQUIS (apixaban), PLAVIX (clopidigrel), BRILINTA (ticagrelor), EFFIENT (prasugrel), PRADAXA (dabigatran), etc  Continue aspirin before & after surgery..     Some oozing/bleeding the first 1-2 weeks is common but should taper down & be small volume.    If you are passing many large clots or having uncontrolling bleeding, call your surgeon    PAIN CONTROL Pain after surgery or related to activity is often due to strain/injury to muscle, tendon, nerves and/or incisions.  This pain is usually short-term and will improve in a few months.  To help speed the process of healing and to get back to regular activity more quickly, DO THE FOLLOWING THINGS TOGETHER: Increase activity gradually.  DO NOT PUSH THROUGH PAIN Use Ice and/or Heat Try Gentle Massage and/or Stretching Take over the counter pain medication Take Narcotic prescription pain medication for more severe pain  Good pain control = faster recovery.  It is better to take more medicine to be more active than to stay in bed all day to avoid medications.  Increase activity gradually Avoid heavy lifting at first, then increase to lifting as tolerated over the next 6 weeks. Do not "push through" the pain.  Listen to your body and avoid positions and maneuvers than reproduce the pain.  Wait a few days before trying something more intense Walking an hour a day is encouraged to help your body recover faster and more safely.  Start slowly and stop when getting sore.  If you can walk 30 minutes without stopping or pain, you can try more intense  activity (running, jogging, aerobics, cycling, swimming, treadmill, sex, sports, weightlifting, etc.) Remember: If it hurts to do it, then don't do it! Use Ice and/or Heat You will have swelling and bruising around the incisions.  This will take several weeks to resolve. Ice packs or heating pads (6-8 times a day, 30-60 minutes at a time) will help sooth soreness & bruising. Some people prefer to use ice alone, heat alone, or alternate between ice & heat.  Experiment and see what works best for you.  Consider trying ice for the first few days to help decrease swelling and bruising; then, switch to heat to help relax sore spots and speed recovery. Shower every day.  Short baths are fine.  It feels good!  Keep the incisions and wounds clean with soap & water.   Try Gentle Massage and/or Stretching Massage at the area of pain many times a day Stop if you feel pain - do not overdo it Take over the counter pain medication This helps the muscle and nerve tissues become less irritable and calm down faster Choose ONE of the following over-the-counter anti-inflammatory  medications: Acetaminophen 500mg  tabs (Tylenol) 1-2 pills with every meal and just before bedtime (avoid if you have liver problems or if you have acetaminophen in you narcotic prescription) Naproxen 220mg  tabs (ex. Aleve, Naprosyn) 1-2 pills twice a day (avoid if you have kidney, stomach, IBD, or bleeding problems) Ibuprofen 200mg  tabs (ex. Advil, Motrin) 3-4 pills with every meal and just before bedtime (avoid if you have kidney, stomach, IBD, or bleeding problems) Take with food/snack several times a day as directed for at least 2 weeks to help keep pain / soreness down & more manageable. Take Narcotic prescription pain medication for more severe pain A prescription for strong pain control is often given to you upon discharge (for example: oxycodone/Percocet, hydrocodone/Norco/Vicodin, or tramadol/Ultram) Take your pain medication as  prescribed. Be mindful that most narcotic prescriptions contain Tylenol (acetaminophen) as well - avoid taking too much Tylenol. If you are having problems/concerns with the prescription medicine (does not control pain, nausea, vomiting, rash, itching, etc.), please call us [phone] to see if we need to switch you to a different pain medicine that will work better for you and/or control your side effects better. If you need a refill on your pain medication, you must call the office before 4 pm and on weekdays only.  By federal law, prescriptions for narcotics cannot be called into a pharmacy.  They must be filled out on paper & picked up from our office by the patient or authorized caretaker.  Prescriptions cannot be filled after 4 pm nor on weekends.    WHEN TO CALL us [phone] Severe uncontrolled or worsening pain  Fever over 101 F (38.5 C) Concerns with the incision: Worsening pain, redness, rash/hives, swelling, bleeding, or drainage Reactions / problems with new medications (itching, rash, hives, nausea, etc.) Nausea and/or vomiting Difficulty urinating Difficulty breathing Worsening fatigue, dizziness, lightheadedness, blurred vision Other concerns If you are not getting better after two weeks or are noticing you are getting worse, contact our office [phone] for further advice.  We may need to adjust your medications, re-evaluate you in the office, send you to the emergency room, or see what other things we can do to help. The clinic staff is available to answer your questions during regular business hours (8:30am-5pm).  Please don't hesitate to call and ask to speak to one of our nurses for clinical concerns.    A surgeon from [facility] is always on call at the hospitals 24 hours/day If you have a medical emergency, go to the nearest emergency room or call 911.  FOLLOW UP in our office One the day of your discharge from the hospital (or the next business  weekday), please call [facility] to set up or confirm an appointment to see your surgeon in the office for a follow-up appointment.  Usually it is 2-3 weeks after your surgery.   If you have skin staples at your incision(s), let the office know so we can set up a time in the office for the nurse to remove them (usually around 10 days after surgery). Make sure that you call for appointments the day of discharge (or the next business weekday) from the hospital to ensure a convenient appointment time. IF YOU HAVE DISABILITY OR FAMILY LEAVE FORMS, BRING THEM TO THE OFFICE FOR PROCESSING.  DO NOT GIVE THEM TO YOUR DOCTOR.  [facility] [address], [place], [place]  [address] ? [phone] - Main [phone] -  Toll Free,  [phone] - Fax [email or web address]    GETTING TO GOOD BOWEL HEALTH. It is expected for your digestive tract to need a few months to get back to normal.  It is common for your bowel movements and stools to be irregular.  You will have occasional bloating and cramping that should eventually fade away.  Until you are eating solid food normally, off all pain medications, and back to regular activities; your bowels will not be normal.   Avoiding constipation The goal: ONE SOFT BOWEL MOVEMENT A DAY!    Drink plenty of fluids.  Choose water first. TAKE A FIBER SUPPLEMENT EVERY DAY THE REST OF YOUR LIFE During your first week back home, gradually add back a fiber supplement every day Experiment which form you can tolerate.   There are many forms such as powders, tablets, wafers, gummies, etc Psyllium bran (Metamucil), methylcellulose (Citrucel), Miralax or Glycolax, Benefiber, Flax Seed.  Adjust the dose week-by-week (1/2 dose/day to 6 doses a day) until you are moving your bowels 1-2 times a day.  Cut back the dose or try a different fiber product if it is giving you problems such as diarrhea or bloating. Sometimes a  laxative is needed to help jump-start bowels if constipated until the fiber supplement can help regulate your bowels.  If you are tolerating eating & you are farting, it is okay to try a gentle laxative such as double dose MiraLax, prune juice, or Milk of Magnesia.  Avoid using laxatives too often. Stool softeners can sometimes help counteract the constipating effects of narcotic pain medicines.  It can also cause diarrhea, so avoid using for too long. If you are still constipated despite taking fiber daily, eating solids, and a few doses of laxatives, call our office. Controlling diarrhea Try drinking liquids and eating bland foods for a few days to avoid stressing your intestines further. Avoid dairy products (especially milk & ice cream) for a short time.  The intestines often can lose the ability to digest lactose when stressed. Avoid foods that cause gassiness or bloating.  Typical foods include beans and other legumes, cabbage, broccoli, and dairy foods.  Avoid greasy, spicy, fast foods.  Every person has some sensitivity to other foods, so listen to your body and avoid those foods that trigger problems for you. Probiotics (such as active yogurt, Align, etc) may help repopulate the intestines and colon with normal bacteria and calm down a sensitive digestive tract Adding a fiber supplement gradually can help thicken stools by absorbing excess fluid and retrain the intestines to act more normally.  Slowly increase the dose over a few weeks.  Too much fiber too soon can backfire and cause cramping & bloating. It is okay to try and slow down diarrhea with a few doses of antidiarrheal medicines.   Bismuth subsalicylate (ex. Kayopectate, Pepto Bismol) for a few doses can help control diarrhea.  Avoid if pregnant.   Loperamide (Imodium) can slow down diarrhea.  Start with one tablet (2mg ) first.  Avoid if you are having fevers or severe pain.  ILEOSTOMY PATIENTS WILL HAVE CHRONIC DIARRHEA since their  colon is not in use.    Drink plenty of liquids.  You will need to drink even more glasses of water/liquid a day to avoid getting dehydrated. Record output from your ileostomy.  Expect to empty the bag every 3-4 hours at first.  Most people with a permanent ileostomy empty their bag 4-6 times at the least.   Use antidiarrheal  medicine (especially Imodium) several times a day to avoid getting dehydrated.  Start with a dose at bedtime & breakfast.  Adjust up or down as needed.  Increase antidiarrheal medications as directed to avoid emptying the bag more than 8 times a day (every 3 hours). Work with your wound ostomy nurse to learn care for your ostomy.  See ostomy care instructions. TROUBLESHOOTING IRREGULAR BOWELS 1) Start with a soft & bland diet. No spicy, greasy, or fried foods.  2) Avoid gluten/wheat or dairy products from diet to see if symptoms improve. 3) Miralax 17gm or flax seed mixed in 8oz. water or juice-daily. May use 2-4 times a day as needed. 4) Gas-X, Phazyme, etc. as needed for gas & bloating.  5) Prilosec (omeprazole) over-the-counter as needed 6)  Consider probiotics (Align, Activa, etc) to help calm the bowels down  Call your doctor if you are getting worse or not getting better.  Sometimes further testing (cultures, endoscopy, X-ray studies, CT scans, bloodwork, etc.) may be needed to help diagnose and treat the cause of the diarrhea. [facility] [address], [place], [place]  [address] [phone] - Main.    [phone]  - Toll Free.   [phone] - Fax [email or web address]

## 2023-03-20 NOTE — Anesthesia Procedure Notes (Signed)
Procedure Name: Intubation Date/Time: 03/20/2023 8:47 AM  Performed by: Lezlie Lye, CRNAPre-anesthesia Checklist: Patient identified, Emergency Drugs available, Suction available and Patient being monitored Patient Re-evaluated:Patient Re-evaluated prior to induction Oxygen Delivery Method: Circle system utilized Preoxygenation: Pre-oxygenation with 100% oxygen Induction Type: IV induction Ventilation: Mask ventilation without difficulty Laryngoscope Size: Miller and 3 Grade View: Grade I Tube type: Oral Tube size: 7.0 mm Number of attempts: 1 Airway Equipment and Method: Stylet Placement Confirmation: ETT inserted through vocal cords under direct vision, positive ETCO2 and breath sounds checked- equal and bilateral Secured at: 22 cm Tube secured with: Tape Dental Injury: Teeth and Oropharynx as per pre-operative assessment

## 2023-03-20 NOTE — Anesthesia Preprocedure Evaluation (Addendum)
Anesthesia Evaluation  Patient identified by MRN, date of birth, ID band Patient awake    Reviewed: Allergy & Precautions, NPO status , Patient's Chart, lab work & pertinent test results  Airway Mallampati: II  TM Distance: >3 FB Neck ROM: Full    Dental   Pulmonary neg pulmonary ROS   breath sounds clear to auscultation       Cardiovascular negative cardio ROS  Rhythm:Regular Rate:Normal     Neuro/Psych negative neurological ROS     GI/Hepatic Neg liver ROS,,,Crohns dz   Endo/Other  diabetes, Type 2    Renal/GU negative Renal ROS     Musculoskeletal  (+) Arthritis ,    Abdominal   Peds  Hematology negative hematology ROS (+)   Anesthesia Other Findings   Reproductive/Obstetrics                             Anesthesia Physical Anesthesia Plan  ASA: 2  Anesthesia Plan: General   Post-op Pain Management: Ketamine IV* and Ofirmev IV (intra-op)*   Induction: Intravenous  PONV Risk Score and Plan: 4 or greater and Dexamethasone, Ondansetron, Midazolam, Treatment may vary due to age or medical condition and Scopolamine patch - Pre-op  Airway Management Planned: Oral ETT  Additional Equipment:   Intra-op Plan:   Post-operative Plan: Extubation in OR  Informed Consent: I have reviewed the patients History and Physical, chart, labs and discussed the procedure including the risks, benefits and alternatives for the proposed anesthesia with the patient or authorized representative who has indicated his/her understanding and acceptance.     Dental advisory given  Plan Discussed with: CRNA  Anesthesia Plan Comments:        Anesthesia Quick Evaluation

## 2023-03-20 NOTE — Interval H&P Note (Signed)
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  The various methods of treatment have been discussed with the patient and family. After consideration of risks, benefits and other options for treatment, the patient has consented to  Procedure(s): ROBOTIC COLECTOMY ILEOCECAL (N/A) as a surgical intervention.  The patient's history has been reviewed, patient examined, no change in status, stable for surgery.  I have reviewed the patient's chart and labs.  Questions were answered to the patient's satisfaction.    I have re-reviewed the the patient's records, history, medications, and allergies.  I have re-examined the patient.  I again discussed intraoperative plans and goals of post-operative recovery.  The patient agrees to proceed.  Sarah Meadows  07-31-1965 [id number]  Patient Care Team: Henry Russel, MD as PCP - General (Family Medicine) Karie Soda, MD as Consulting Physician (General Surgery) Armbruster, Willaim Rayas, MD as Consulting Physician (Gastroenterology)  Patient Active Problem List   Diagnosis Date Noted   Bilateral sacroiliitis (HCC) 10/07/2018   Controlled substance agreement signed 08/01/2018   Restless leg 04/01/2018   History of diverticulitis 03/28/2018   Obesity (BMI 30.0-34.9) 03/28/2018   Crohn's disease (HCC) 04/11/2017    Past Medical History:  Diagnosis Date   Anemia    Arthritis    Crohn's disease (HCC) 2006   Crohn's disease of both small and large intestine (HCC)    Diabetes (HCC)    Type 2   Headache    Hyperlipidemia 01/2020   Lichen planus    Restless leg syndrome    Sacroiliac inflammation (HCC)     Past Surgical History:  Procedure Laterality Date   CESAREAN SECTION     x 2   CHOLECYSTECTOMY     COLONOSCOPY  04/2018    Social History   Socioeconomic History   Marital status: Single    Spouse  name: Not on file   Number of children: 4   Years of education: Not on file   Highest education level: Not on file  Occupational History   Not on file  Tobacco Use   Smoking status: Never   Smokeless tobacco: Never  Vaping Use   Vaping status: Never Used  Substance and Sexual Activity   Alcohol use: Yes    Comment: Rare   Drug use: Never   Sexual activity: Not on file  Other Topics Concern   Not on file  Social History Narrative   Not on file   Social Determinants of Health   Financial Resource Strain: Unknown (12/25/2020)   Received from [facility], [facility]   Overall Financial Resource Strain (CARDIA)    Difficulty of Paying Living Expenses: Patient declined  Food Insecurity: Unknown (12/25/2020)   Received from [facility], [facility]   Hunger Vital Sign    Worried About Running Out of Food in the Last Year: Patient declined    Ran Out of Food in the Last Year: Patient declined  Transportation Needs: No Transportation Needs (12/25/2020)   Received from [facility], [facility]   PRAPARE - Transportation    Lack of Transportation (Medical): No    Lack of Transportation (Non-Medical): No  Physical Activity: Insufficiently Active (12/25/2020)   Received from [facility], [facility]   Exercise Vital Sign    Days of Exercise per Week: 2 days    Minutes of Exercise per Session: 30 min  Stress:  No Stress Concern Present (12/25/2020)   Received from [facility], [facility] - Occupational Stress Questionnaire    Feeling of Stress : Only a little  Social Connections: Moderately Integrated (02/25/2022)   Received from [facility], [facility]   Social Network    How would you rate your social network (family, work, friends)?: Adequate participation with social networks  Intimate Partner Violence: Not At Risk (02/25/2022)   Received from [facility], [facility]   HITS    Over the last 12 months how  often did your partner physically hurt you?: Never    Over the last 12 months how often did your partner insult you or talk down to you?: Never    Over the last 12 months how often did your partner threaten you with physical harm?: Never    Over the last 12 months how often did your partner scream or curse at you?: Never    Family History  Problem Relation Age of Onset   Diabetes Mother    Blindness Father    Dementia Father    Diabetes Father    Crohn's disease Son    Colon cancer Neg Hx    Pancreatic cancer Neg Hx    Stomach cancer Neg Hx    Colon polyps Neg Hx    Esophageal cancer Neg Hx    Rectal cancer Neg Hx     Facility-Administered Medications Prior to Admission  Medication Dose Route Frequency Provider Last Rate Last Admin   dextrose 5 % solution   Intravenous Continuous Armbruster, Willaim Rayas, MD       Medications Prior to Admission  Medication Sig Dispense Refill Last Dose   acidophilus (RISAQUAD) CAPS capsule Take 1 capsule by mouth daily.   03/06/2023   augmented betamethasone dipropionate (DIPROLENE-AF) 0.05 % ointment Apply 1 Application topically daily as needed (for irritation- affected areas).   03/15/2023   Cholecalciferol (VITAMIN D3) 1000 units CAPS Take 1,000 Units by mouth daily.   03/06/2023   clobetasol cream (TEMOVATE) 0.05 % Apply 1 application  topically 2 (two) times daily.   03/15/2023   Coenzyme Q10 200 MG capsule Take 200 mg by mouth daily.   Past Week   cyanocobalamin 1000 MCG tablet Take 1,000 mcg by mouth daily.   Past Week   hydroquinone 4 % cream Apply 1 Application topically See admin instructions. Apply to affected areas 2 times a day   03/11/2023   hydroxychloroquine (PLAQUENIL) 200 MG tablet Take 200 mg by mouth daily.   Past Month   MOUNJARO 10 MG/0.5ML Pen Inject 10 mg into the skin every Wednesday.   03/06/2023   Multiple Vitamin (MULTIVITAMIN WITH MINERALS) TABS tablet Take 1 tablet by mouth daily.   03/06/2023   nystatin cream  (MYCOSTATIN) Apply 1 Application topically as needed.   Past Month   Risankizumab-rzaa (SKYRIZI) 360 MG/2.4ML SOCT Inject 1 Pen into the skin every 8 (eight) weeks. (Patient taking differently: Inject 360 mg into the skin every 8 (eight) weeks.) 2.4 mL 6 Past Month   rizatriptan (MAXALT) 10 MG tablet Take 10 mg by mouth as needed for migraine.   03/15/2023   triamcinolone ointment (KENALOG) 0.1 % Apply 1 Application topically 2 (two) times daily as needed (for itching).   Past Month   blood glucose meter kit and supplies Dispense based on patient and insurance preference. Use up to four times daily as directed. (FOR ICD-10 E10.9, E11.9). 1 each 0  dicyclomine (BENTYL) 10 MG capsule Take 1 capsule (10 mg total) by mouth every 8 (eight) hours as needed (abdominal cramping or pain). (Patient not taking: Reported on 03/08/2023) 30 capsule 2 Not Taking   ondansetron (ZOFRAN-ODT) 4 MG disintegrating tablet Take 1 tablet (4 mg total) by mouth every 6 (six) hours as needed for nausea or vomiting. (Patient taking differently: Take 4 mg by mouth every 6 (six) hours as needed for nausea or vomiting (DISSOLVE ORALLY).) 30 tablet 3 More than a month   PEG-KCl-NaCl-NaSulf-Na Asc-C (PLENVU) 140 g SOLR Take 1 kit by mouth as directed. Use coupon: BIN: 761607 PNC: CNRX Group: [id number] ID: [id number] (Patient not taking: Reported on 03/08/2023) 1 each 0 Not Taking   promethazine (PHENERGAN) 25 MG tablet Take 25 mg by mouth every 4 (four) hours as needed for nausea or vomiting.   More than a month   sucralfate (CARAFATE) 1 g tablet TAKE 1 TABLET BY MOUTH EVERY 8 HOURS AS NEEDED. SLOWLY DISSOLVE TABLET IN 1 TABLESPOON OF DISTILLED WATER PRIOR TO INGESTING (Patient not taking: Reported on 03/08/2023) 180 tablet 1 Not Taking    No current facility-administered medications for this encounter.     Allergies  Allergen Reactions   Adalimumab Rash and Other (See Comments)    Humira   Nsaids Other (See Comments)     Crohn's = NO NSAIDs    BP 117/78   Pulse 81   Temp 99.3 F (37.4 C) (Oral)   Resp 20   Ht 5\' 1"  (1.549 m)   Wt 69.9 kg   SpO2 98%   BMI 29.10 kg/m   Labs: No results found for this or any previous visit (from the past 48 hour(s)).  Imaging / Studies: No results found.   Ardeth Sportsman, M.D., F.A.C.S. Gastrointestinal and Minimally Invasive Surgery [facility] [address], [address] [place], [place] [address] [phone] Main / Paging  03/20/2023 7:29 AM     Ardeth Sportsman

## 2023-03-20 NOTE — Op Note (Signed)
03/20/2023  10:57 AM  PATIENT:  Sarah Meadows  57 y.o. 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  This assistant was needed for exposure, dissection, suction, tissue approximation, retraction, perception, etc  ANESTHESIA:  General endotracheal intubation anesthesia (GETA) and Regional TRANSVERSUS ABDOMINIS PLANE (TAP) nerve block -BILATERAL for perioperative & postoperative pain control at the level of the transverse abdominis & preperitoneal spaces along the flank at the anterior axillary line, from subcostal ridge to iliac crest under laparoscopic guidance provided with liposomal bupivacaine (Experel) 20mL mixed with 50 mL of bupivicaine 0.25% with epinephrine  Estimated Blood Loss (EBL):   Total I/O In: 450 [IV Piggyback:450] Out: 225 [Urine:175; Blood:50].   (See anesthesia record)  Delay start of Pharmacological VTE agent (>24hrs) due to concerns of significant anemia, surgical blood loss, or risk of bleeding?:  no  DRAINS: (None)  SPECIMEN:  Distal ileum, appendix, cecum,ascending colon  DISPOSITION OF SPECIMEN:  Pathology  COUNTS:  Sponge, needle, & instrument counts CORRECT  PLAN OF CARE: Admit to inpatient   PATIENT DISPOSITION:  PACU - hemodynamically stable.  INDICATION:    Pleasant woman with history of Crohn's disease for quite some time.  She has been  on numerous medication regimens including Remicade and Humira.  Most recently on Skyrizi with better control but still with some persistent obstructive symptoms.  MR enterography and colonoscopy ordered in the care of [facility] and patient has persistent noninflamed distal ileal stricture.  No prior resections.  Given persistent obstructive like symptoms despite control of her Crohn's with known stricture recommendation made for segmental resection.:  The anatomy & physiology of the digestive tract was discussed.  The pathophysiology was discussed.  Natural history risks without surgery was discussed.   I worked to give an overview of the disease and the frequent need to have multispecialty involvement.  I feel the risks of no intervention will lead to serious problems that outweigh the operative risks; therefore, I recommended a partial ileocolectomy to remove the pathology.  Minimally invasive & open techniques were discussed.   Risks such as bleeding, infection, abscess, leak, reoperation, possible ostomy, hernia, heart attack, death, and other risks were discussed.  I noted a good likelihood this will help address the problem.   Goals of post-operative recovery were discussed as well.  We will work to minimize complications.  An educational handout on the pathology was given as well.  Questions were answered.    The patient expresses understanding & wishes to proceed with surgery.  OR FINDINGS:   Patient had distal ileal stricture 10 cm with chronic thickening but no major inflammation.  Some thickness at the ileocecal valve.  Correlating with the 2 strictures noted by MR enterography and colonoscopy..  No skip lesions or other abnormalities in the remaining small intestine.  No obvious colon abnormalities.  It is an isoperistaltic ileocolonic anastomosis (distal ileum to hepatic flexure) that rests in the right upper quadrant.  CASE DATA: Type  of patient?: Elective [facility] Private  Case Status of Case? Elective Scheduled Infection Present At Time Of Surgery (PATOS)?  NO     DESCRIPTION:   Informed consent was confirmed.  The patient underwent general anaesthesia without difficulty.  The patient was positioned with arms tucked & secured appropriately.  VTE prevention in place.  The patient's abdomen was clipped, prepped, & draped in a sterile fashion.  Surgical timeout confirmed our plan.  The patient was positioned in reverse Trendelenburg.  Abdominal entry was gained using Varess technique at the left subcostal ridge on the anterior abdominal wall.  No elevated EtCO2 noted.  Port placed.  Camera inspection revealed no injury.  Extra ports were carefully placed under direct laparoscopic visualization.  We docked the [employer] Vinci robot carefully and placed intstruments under visualization  I mobilized & reflected the greater omentum in the upper abdomen.  Freed off omental adhesions off the anterior abdominal wall.  No hernias noted.  Positioned the small bowel into the left lower quadrant and pelvis.   We could identify the thickened ileum with creeping fat.  Seen more fibrotic with no major inflammation.  There seem to be a short segment of sparing at the terminal ileum and then a little bit thick and irritated at the cecum with thickened and mildly inflamed cecal and ascending colon mesentery and distal ileal mesentery.  I then ran the small bowel from the ileocecal valve proximally ligament of Treitz.  Encountered no other skip lesions or other concerns.    Decided to focus on the segmental resection.  Found the area him in the distal ileum that had no creeping fat nor thickening and made a mesenteric defect there for the proximal margin.  Found area in the mid ascending colon that did not appear to be inflamed and came through the mesentery there little bit.  I therefore transected through that ileocolonic mesentery involving the pedicle about halfway down, taking care  to spare good marginal collateral blood supply.  Then focused on mobilizing the ileocecal region in a lateral medial fashion.  Somewhat thick and took time but eventually got everything elevated and cleared.  Confirmed that ileum proximal to the area was soft and noninflamed.  Confirmed hepatic flexure was not inflamed.  Decided to spare that to avoid reresections given her history of Crohn disease.  Ended up mobilizing hepatic flexure in a superior to inferior fashion to help straighten out that corner given her history of prior cholecystectomy.  When he went ahead and proceeded with transection.  We transected at the distal ileum with a robotic stapler 60mm blue load.  We then transected mid ascending colon with a robotic stapler 60mm blue load.  We confirmed hemostasis.   We did a side-to-side stapled anastomosis of ileum to mid ascending colon using a 60mm white load in an isoperistaltic fashion.  (Distal stump of ileum to mid transverse colon for the distal end of the anastomosis.  Proximal end of colon stump to more proximal ileum for the proximal end of the anastomosis).  I made sure I had 5 cm from each end of staple line before I did the ileal enterotomy in the colon colotomy to allow room for expansion and minimize issues of anastomotic stricturing should her Crohn's return or worsen.  Confirmed healthy viable mucosa of ileum and colon.  I sewed the common staple channel wound with an absorbable suture (V-lock) in a running Stanley fashion from each corner and meeting in the center.  We did  meticulous inspection prove an airtight closure.  I then ran the V-Loc suture to help tack the distal ileal staple line to the hepatic flexure mesentery and closed the ileocolonic mesenteric defect completely, trying to use some redundant epiploic appendages to avoid heavy mesenteric involvement.  Confirmed that the ileal colonic defect in the mesentery was closed.  Did not do an omentopexy to avoid any fat  inflammation given her history of inflammatory bowel disease.   We did reinspection of the abdomen.  Hemostasis was good.  Ureters, retroperitoneum, and bowel uninjured.  The anastomosis looked healthy.   No twisting or torsion of the mesentery.  Endoluminal gas was evacuated.  We placed the wound protector through the suprapubic 12mm port site after it was enlarged in a Pfannenstiel fashion.  Specimen removed without incident.   Ports & wound protector removed.  Hemostasis was good.  Sterile unused instruments were used from this point.  I closed the skin at the port sites using Monocryl stitch and sterile dressing.  I closed the extraction wound using a 0 Vicryl vertical peritoneal closure and a #1 PDS transverse anterior rectal fascial closure like a small Pfannenstiel closure. I closed the skin with some interrupted Monocryl stitches.  I placed sterile dressings.     Patient is being extubated go to recovery room. I discussed postop care with the patient in detail the office & in the holding area. Instructions are written. I discussed operative findings, updated the patient's status, discussed probable steps to recovery, and gave postoperative recommendations to the patient's daughter, Ishanvi Thielke .  Recommendations were made.  Questions were answered.  She expressed understanding & appreciation.  Ardeth Sportsman, M.D., F.A.C.S. Gastrointestinal and Minimally Invasive Surgery [facility] [address] [place], [place] [address] [phone] Main / Paging

## 2023-03-20 NOTE — Transfer of Care (Signed)
Immediate Anesthesia Transfer of Care Note  Patient: Sarah Meadows  Procedure(s) Performed: ROBOTIC COLECTOMY ILEOCECAL  Patient Location: PACU  Anesthesia Type:General  Level of Consciousness: drowsy and patient cooperative  Airway & Oxygen Therapy: Patient Spontanous Breathing and Patient connected to face mask  Post-op Assessment: Report given to RN and Post -op Vital signs reviewed and stable  Post vital signs: Reviewed and stable  Last Vitals:  Vitals Value Taken Time  BP 121/72 03/20/23 1105  Temp    Pulse 88 03/20/23 1107  Resp 16 03/20/23 1107  SpO2 100 % 03/20/23 1107  Vitals shown include unfiled device data.  Last Pain:  Vitals:   03/20/23 0719  TempSrc:   PainSc: 10-Worst pain ever         Complications: No notable events documented.

## 2023-03-20 NOTE — H&P (Signed)
03/20/2023    REFERRING PHYSICIAN: Ruffin Frederick*  Patient Care Team: Marni Griffon, MD as PCP - General (Family Medicine) Michaell Cowing, Shawn Route, MD as Consulting Provider (General Surgery) Armbruster, Reeves Forth, MD (Gastroenterology)  PROVIDER: Jarrett Soho, MD  [facility] MRN: [id number] DOB: Dec 18, 1965  SUBJECTIVE   Chief Complaint: New Consultation   Sarah Meadows is a 57 y.o. female  who is seen today as an office consultation  at the request of DrAdela Lank  for evaluation of intestinal stricture with history of Crohn's  History of Present Illness:  57 year old female. Known Crohn's disease since 2006. Think originally from [place]. Had a cholecystectomy there 20 years ago. Son diagnosed with Crohn's disease and actually had to have a bowel resection down there this year. Followed by [facility]-I think established with them September 2021. Sounds like she was not having bad problems until 2020 when she had worsening symptoms. Found to have some ulcerations in the cecum with a deformed ileocecal valve and ileum not able to be intubated. Treated with Remicade. Some fair compliance that time. Felt worse the following year. Found to have a high antibody level to the Remicade. Transition to Humira. 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 Weaned off Humira and sent to see [facility]. There is discussion about starting on Stelara for a non-anti-TNF regimen. Sounds like it was a struggle to comply with that and she took herself off it.   A year later, September 2023, concern for recurrent Crohn's flare. 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 Some chronic constipated stool as well as left-sided diverticulosis. No evidence of any major colon involvement. She has been on immunosuppression with Cristy Folks for the past 11 months, starting September 2023. Seem to have some  improvement in symptoms but then began to have some cramping and obstructive like symptoms. Repeat colonoscopy performed last month noted a ileocecal stricture and another stricture about 5 cm more proximal to the ileocecal valve. Biopsy showing mildly chronic active Crohn's colitis. Given persistent stricture without much inflammation surgical consultation requested  Patient's had a cholecystectomy but no other abdominal surgeries. No bowel resections. She tends to move her bowels after every time she eats with a lot of small loose bowel movements. Not on any specific supplement. She does not smoke. She has borderline diabetes controlled with Mounjaro. A1c 5.4. Can walk at least half hour without difficulty. No sleep apnea. Not any NSAIDs.  Medical History:  History reviewed. No pertinent past medical history.  Patient Active Problem List  Diagnosis  Crohn's ileitis, with intestinal obstruction (CMS/HHS-HCC)   Past Surgical History:  Procedure Laterality Date  CHOLECYSTECTOMY    Allergies  Allergen Reactions  Adalimumab Rash  Other reaction(s): Unknown  Nsaids (Non-Steroidal Anti-Inflammatory Drug) Other (See Comments)  Crohn's = NO NSAIDS   Current Outpatient Medications on File Prior to Visit  Medication Sig Dispense Refill  tirzepatide (MOUNJARO) 10 mg/0.5 mL pen injector Inject 10 mg subcutaneously once a week   No current facility-administered medications on file prior to visit.   Family History  Problem Relation Age of Onset  Obesity Mother  High blood pressure (Hypertension) Mother  Hyperlipidemia (Elevated cholesterol) Mother  Diabetes Mother  Deep vein thrombosis (DVT or abnormal blood clot formation) Mother  High blood pressure (Hypertension) Father  Hyperlipidemia (Elevated cholesterol) Father  Diabetes Father    Social History   Tobacco Use  Smoking Status Never  Smokeless Tobacco Never  Social History   Socioeconomic History  Marital status: Single   Tobacco Use  Smoking status: Never  Smokeless tobacco: Never  Substance and Sexual Activity  Alcohol use: Yes  Drug use: Never   Social Determinants of Health   Financial Resource Strain: Unknown (12/25/2020)  Received from [facility]  Overall Financial Resource Strain (CARDIA)  Difficulty of Paying Living Expenses: Patient declined  Food Insecurity: Unknown (12/25/2020)  Received from [facility]  Hunger Vital Sign  Worried About Running Out of Food in the Last Year: Patient declined  Ran Out of Food in the Last Year: Patient declined  Transportation Needs: No Transportation Needs (12/25/2020)  Received from [facility] - Transportation  Lack of Transportation (Medical): No  Lack of Transportation (Non-Medical): No  Physical Activity: Insufficiently Active (12/25/2020)  Received from [facility]  Exercise Vital Sign  Days of Exercise per Week: 2 days  Minutes of Exercise per Session: 30 min  Stress: No Stress Concern Present (12/25/2020)  Received from [facility] - Occupational Stress Questionnaire  Feeling of Stress : Only a little  Social Connections: Moderately Integrated (02/25/2022)  Received from [facility]  Social Network  How would you rate your social network (family, work, friends)?: Adequate participation with social networks   ############################################################  Review of Systems: A complete review of systems (ROS) was obtained from the patient.  We have reviewed this information and discussed as appropriate with the patient.  See HPI as well for other pertinent ROS.  Constitutional: No fevers, chills, sweats. Weight stable Eyes: No vision changes, No discharge HENT: No sore throats, nasal drainage Lymph: No neck swelling, No bruising easily Pulmonary: No cough, productive sputum CV: No orthopnea, PND . No exertional chest/neck/shoulder/arm pain. Patient can walk 30  minutes   GI: Crohn's disease in herself & her son. no personal nor family history of GI/colon cancer, irritable bowel syndrome, allergy such as Celiac Sprue, dietary/dairy problems, colitis, ulcers nor gastritis. No recent sick contacts/gastroenteritis. No travel outside the country. No changes in diet.  Renal: No UTIs, No hematuria Genital: No drainage, bleeding, masses Musculoskeletal: No severe joint pain. Good ROM major joints Skin: No sores or lesions Heme/Lymph: No easy bleeding. No swollen lymph nodes Neuro: No active seizures. No facial droop Psych: No hallucinations. No agitation  OBJECTIVE   Vitals:  11/28/22 1515  BP: 118/78  Pulse: 96  Temp: 36.6 C (97.9 F)  SpO2: 96%  Weight: 69.6 kg (153 lb 6.4 oz)  Height: 154.9 cm (5\' 1" )  PainSc: 0-No pain  PainLoc: Abdomen   Body mass index is 28.98 kg/m.  PHYSICAL EXAM:  Constitutional: Not cachectic. Hygeine adequate. Vitals signs as above.  Eyes: Wears glasses - vision corrected,Pupils reactive, normal extraocular movements. Sclera nonicteric Neuro: CN II-XII intact. No major focal sensory defects. No major motor deficits. Lymph: No head/neck/groin lymphadenopathy Psych: No severe agitation. No severe anxiety. Judgment & insight Adequate, Oriented x4, HENT: Normocephalic, Mucus membranes moist. No thrush. Hearing: adequate Neck: Supple, No tracheal deviation. No obvious thyromegaly Chest: No pain to chest wall compression. Good respiratory excursion. No audible wheezing CV: Pulses intact. regular. No major extremity edema Ext: No obvious deformity or contracture. Edema: Not present. No cyanosis Skin: No major subcutaneous nodules. Warm and dry Musculoskeletal: Severe joint rigidity not present. No obvious clubbing. No digital petechiae. Mobility: no assist device moving easily without restrictions  Abdomen: Obese with panniculus Soft. Nondistended. Nontender. Hernia: Not present. Diastasis recti: Not  present. No  hepatomegaly. No splenomegaly.  Genital/Pelvic: Inguinal hernia: Not present. Inguinal lymph nodes: without lymphadenopathy nor hidradenitis.   Rectal: (Deferred)    ###################################################################  Labs, Imaging and Diagnostic Testing:  Located in 'Care Everywhere' section of Epic EMR chart  PRIOR [facility] CLINIC NOTES:  Not applicable  SURGERY NOTES:  Not applicable  PATHOLOGY:  Located in 'Care Everywhere' section of Epic EMR chart  Assessment and Plan:  DIAGNOSES:  Diagnoses and all orders for this visit:  Crohn's ileitis, with intestinal obstruction (CMS/HHS-HCC)  Other orders - polyethylene glycol (MIRALAX) powder; Take 233.75 g by mouth once for 1 dose Take according to your procedure prep instructions. - bisacodyL (DULCOLAX) 5 mg EC tablet; Take 4 tablets (20 mg total) by mouth once daily as needed for Constipation for up to 1 dose - metroNIDAZOLE (FLAGYL) 500 MG tablet; Take 2 tablets (1,000 mg total) by mouth 3 (three) times daily for 3 doses SEE BOWEL PREP INSTRUCTIONS: Take 2 tablets at 2pm, 3pm, and 10pm the day prior to your colon operation. - neomycin 500 mg tablet; Take 2 tablets (1,000 mg total) by mouth 3 (three) times daily for 3 doses SEE BOWEL PREP INSTRUCTIONS: Take 2 tablets at 2pm, 3pm, and 10pm the day prior to your colon operation.    ASSESSMENT/PLAN  Chronic Crohn's disease with repeated flares requiring numerous adjustments in immunosuppression from Remicade to Humira to Stelara.  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  I think is reasonable can do segmental ileocolonic resection of the 2 strictured areas with intracorporeal anastomosis robotically. Patient likes the idea of doing it - delayed over winter break.  Ready for surgery.  The anatomy & physiology of the digestive tract was discussed.  The pathophysiology of the colon was discussed. Natural history risks without surgery was discussed. I feel the risks of no intervention will lead to serious problems that outweigh the operative risks; therefore, I recommended a partial colectomy to remove the pathology. Minimally invasive (Robotic/Laparoscopic) & open techniques were discussed.   Risks such as bleeding, infection, abscess, leak, reoperation, injury to other organs, need for repair of tissues / organs, possible ostomy, hernia, heart attack, stroke, death, and other risks were discussed. I noted a good likelihood this will help address the problem. Goals of post-operative recovery were discussed as well. Need for adequate nutrition, daily bowel regimen and healthy physical activity, to optimize recovery was noted as well. We will work to minimize complications. Educational materials were available as well. Questions were answered. The patient expresses understanding & wishes to proceed with surgery.   Ardeth Sportsman, MD, FACS, MASCRS Esophageal, Gastrointestinal & Colorectal Surgery Robotic and Minimally Invasive Surgery  [facility] [address] [place], [place] [address] [phone] Fax [phone] Main  CONTACT INFORMATION: Weekday (9AM-5PM): Call [facility] main office at [phone] Weeknight (5PM-9AM) or Weekend/Holiday: Check EPIC "Web Links" tab & use "AMION" (password " [email or web address]") for General Surgery [facility] coverage  Please, DO NOT use SecureChat  (it is not reliable communication to reach operating surgeons & will lead to a delay in care).   Epic staff messaging available for outptient concerns needing 1-2 business day response.     03/20/2023

## 2023-03-20 NOTE — Anesthesia Postprocedure Evaluation (Signed)
Anesthesia Post Note  Patient: Sarah Meadows  Procedure(s) Performed: ROBOTIC COLECTOMY ILEOCECAL     Patient location during evaluation: PACU Anesthesia Type: General Level of consciousness: awake and alert Pain management: pain level controlled Vital Signs Assessment: post-procedure vital signs reviewed and stable Respiratory status: spontaneous breathing, nonlabored ventilation, respiratory function stable and patient connected to nasal cannula oxygen Cardiovascular status: blood pressure returned to baseline and stable Postop Assessment: no apparent nausea or vomiting Anesthetic complications: no  No notable events documented.  Last Vitals:  Vitals:   03/20/23 1422 03/20/23 1545  BP: 121/64 129/71  Pulse: 87 85  Resp: 17 17  Temp: 36.7 C 37 C  SpO2: 99% 100%    Last Pain:  Vitals:   03/20/23 1545  TempSrc: Oral  PainSc:                  Kennieth Rad

## 2023-03-21 LAB — CBC
HCT: 33.7 % — ABNORMAL LOW (ref 36.0–46.0)
Hemoglobin: 11.2 g/dL — ABNORMAL LOW (ref 12.0–15.0)
MCH: 30.6 pg (ref 26.0–34.0)
MCHC: 33.2 g/dL (ref 30.0–36.0)
MCV: 92.1 fL (ref 80.0–100.0)
Platelets: 227 10*3/uL (ref 150–400)
RBC: 3.66 MIL/uL — ABNORMAL LOW (ref 3.87–5.11)
RDW: 13.2 % (ref 11.5–15.5)
WBC: 10.2 10*3/uL (ref 4.0–10.5)
nRBC: 0 % (ref 0.0–0.2)

## 2023-03-21 LAB — BASIC METABOLIC PANEL
Anion gap: 6 (ref 5–15)
BUN: 8 mg/dL (ref 6–20)
CO2: 24 mmol/L (ref 22–32)
Calcium: 8.9 mg/dL (ref 8.9–10.3)
Chloride: 107 mmol/L (ref 98–111)
Creatinine, Ser: 0.72 mg/dL (ref 0.44–1.00)
GFR, Estimated: >60 mL/min (ref >60)
Glucose, Bld: 106 mg/dL — ABNORMAL HIGH (ref 70–99)
Potassium: 4.1 mmol/L (ref 3.5–5.1)
Sodium: 137 mmol/L (ref 135–145)

## 2023-03-21 LAB — MAGNESIUM: Magnesium: 1.7 mg/dL (ref 1.7–2.4)

## 2023-03-21 MED ORDER — TRAMADOL HCL 50 MG PO TABS: 50.0000 mg | ORAL_TABLET | Freq: Four times a day (QID) | ORAL | 0 refills | Status: DC | PRN

## 2023-03-21 MED ORDER — RISAQUAD PO CAPS
1.0000 | ORAL_CAPSULE | Freq: Every day | ORAL | Status: DC
Start: 2023-03-21 — End: 2023-03-22
  Administered 2023-03-21 – 2023-03-22 (×2): 1 via ORAL
  Filled 2023-03-21 (×2): qty 1

## 2023-03-21 MED ORDER — LACTATED RINGERS IV SOLN: Freq: Three times a day (TID) | INTRAVENOUS | Status: AC | PRN

## 2023-03-21 MED ORDER — VITAMIN B-12 1000 MCG PO TABS
1000.0000 ug | ORAL_TABLET | Freq: Every day | ORAL | Status: DC
Start: 2023-03-21 — End: 2023-03-22
  Administered 2023-03-21: 1000 ug via ORAL
  Filled 2023-03-21 (×2): qty 1

## 2023-03-21 NOTE — Progress Notes (Signed)
03/21/2023  Sarah Meadows [id number] 1965-05-22  CARE TEAM: PCP: Henry Russel, MD  Outpatient Care Team: Patient Care Team: Henry Russel, MD as PCP - General (Family Medicine) Karie Soda, MD as Consulting Physician (General Surgery) Armbruster, Willaim Rayas, MD as Consulting Physician (Gastroenterology)  Inpatient Treatment Team: Treatment Team:  Karie Soda, MD Fuller Canada, NT Jolene Provost, RN Pershing Cox, NT   Problem List:   Principal Problem:   Crohn's ileitis, with intestinal obstruction [facility]) Active Problems:   Obesity (BMI 30.0-34.9)   Generalized anxiety disorder   Type 2 diabetes mellitus (HCC)   Crohn's disease of ileum with intestinal obstruction (HCC)   03/20/2023  POST-OPERATIVE DIAGNOSIS:  CROHNS DISEASE WITH CHRONIC ILEAL AND ILEOCECAL STRICTURES  PROCEDURE:   ROBOTIC ILEOCOLECTOMY WITH ANASTOMOSIS TRANSVERSUS ABDOMINIS PLANE (TAP) BLOCK - BILATERAL  SURGEON:  Ardeth Sportsman, MD  OR FINDINGS:    Patient had distal ileal stricture 10 cm with chronic thickening but no major inflammation.  Some thickness at the ileocecal valve.  Correlating with the 2 strictures noted by MR enterography and colonoscopy..  No skip lesions or other abnormalities in the remaining small intestine.  No obvious colon abnormalities.   It is an isoperistaltic ileocolonic anastomosis (distal ileum to hepatic flexure) that rests in the right upper quadrant.  Assessment [facility] Stay = 1 days) 1 Day Post-Op    Stable    Plan:  ERAS pathway  Tolerating clears.  Gradually advance diet  Medlock IV fluids per protocol.  Try and keep on the dry side.  PRN (as needed) boluses for backup  Follow-up on pathology.  Seems like chronic fibrotic ileal stricture.  Most likely she can resume her immunosuppressive Skyrizi regimen around 3 weeks after surgery.  Would postpone her usual dose that would be next week.  I think that is too soon.  -VTE  prophylaxis- SCDs, etc  -mobilize as tolerated to help recovery  -Disposition:  Disposition:  The patient is from: Home Anticipate discharge to:  Home Anticipated Date of Discharge is:  December 6,2024   Barriers to discharge:  Pending Clinical improvement (more likely than not)  Patient currently is NOT MEDICALLY STABLE for discharge from the hospital from a surgery standpoint.      I reviewed last 24 h vitals and pain scores, last 48 h intake and output, last 24 h labs and trends, and last 24 h imaging results.  I have reviewed this patient's available data, including medical history, events of note, test results, etc as part of my evaluation.   A significant portion of that time was spent in counseling. Care during the described time interval was provided by me.  This care required moderate level of medical decision making.  03/21/2023    Subjective: (Chief complaint)  No major events Tolerated liquids.  Foley Manny  Objective:  Vital signs:  Vitals:   03/20/23 2045 03/21/23 0130 03/21/23 0500 03/21/23 0503  BP: (!) 101/57 95/61  94/64  Pulse: 82 64  71  Resp: 18 19  16   Temp: 98.1 F (36.7 C) 97.8 F (36.6 C)  98.2 F (36.8 C)  TempSrc: Oral Oral  Oral  SpO2: 99% 98%  98%  Weight:   74.4 kg   Height:        Last BM Date : 03/20/23  Intake/Output   Yesterday:  12/04 0701 - 12/05 0700 In: 3441.2 [P.O.:1450; I.V.:1541.2; IV Piggyback:450] Out: 3175 [Urine:3125; Blood:50] This shift:  Total I/O In: 1471.2 [P.O.:1060; I.V.:411.2]  Out: 1400 [Urine:1400]  Bowel function:  Flatus: YES  BM:  No  Drain: (No drain)   Physical Exam:  General: Pt awake/alert in no acute distress Eyes: PERRL, normal EOM.  Sclera clear.  No icterus Neuro: CN II-XII intact w/o focal sensory/motor deficits. Lymph: No head/neck/groin lymphadenopathy Psych:  No delerium/psychosis/paranoia.  Oriented x 4 HENT: Normocephalic, Mucus membranes moist.  No thrush Neck:  Supple, No tracheal deviation.  No obvious thyromegaly Chest: No pain to chest wall compression.  Good respiratory excursion.  No audible wheezing CV:  Pulses intact.  Regular rhythm.  No major extremity edema MS: Normal AROM mjr joints.  No obvious deformity  Abdomen: Soft.  Nondistended.  Mildly tender at incisions only.  No evidence of peritonitis.  No incarcerated hernias.  Ext:  No deformity.  No mjr edema.  No cyanosis Skin: No petechiae / purpurea.  No major sores.  Warm and dry    Results:   Cultures: No results found for this or any previous visit (from the past 720 hour(s)).  Labs: Results for orders placed or performed during the hospital encounter of 03/20/23 (from the past 48 hour(s))  ABO/Rh     Status: None   Collection Time: 03/20/23  7:50 AM  Result Value Ref Range   ABO/RH(D)      A POS Performed at [facility], [address], [place], [place] [address]   Glucose, capillary     Status: None   Collection Time: 03/20/23  7:58 AM  Result Value Ref Range   Glucose-Capillary 82 70 - 99 mg/dL    Comment: Glucose reference range applies only to samples taken after fasting for at least 8 hours.   Comment 1 Notify RN    Comment 2 Document in Chart   Glucose, capillary     Status: Abnormal   Collection Time: 03/20/23 11:41 AM  Result Value Ref Range   Glucose-Capillary 133 (H) 70 - 99 mg/dL    Comment: Glucose reference range applies only to samples taken after fasting for at least 8 hours.  Basic metabolic panel     Status: Abnormal   Collection Time: 03/21/23  5:11 AM  Result Value Ref Range   Sodium 137 135 - 145 mmol/L   Potassium 4.1 3.5 - 5.1 mmol/L   Chloride 107 98 - 111 mmol/L   CO2 24 22 - 32 mmol/L   Glucose, Bld 106 (H) 70 - 99 mg/dL    Comment: Glucose reference range applies only to samples taken after fasting for at least 8 hours.   BUN 8 6 - 20 mg/dL   Creatinine, Ser 3.24 0.44 - 1.00 mg/dL   Calcium 8.9 8.9 - 40.1 mg/dL    GFR, Estimated >02 >72 mL/min    Comment: (NOTE) Calculated using the CKD-EPI Creatinine Equation (2021)    Anion gap 6 5 - 15    Comment: Performed at [facility], [address] [place], [place] [address]  CBC     Status: Abnormal   Collection Time: 03/21/23  5:11 AM  Result Value Ref Range   WBC 10.2 4.0 - 10.5 K/uL   RBC 3.66 (L) 3.87 - 5.11 MIL/uL   Hemoglobin 11.2 (L) 12.0 - 15.0 g/dL   HCT 40.3 (L) 47.4 - 25.9 %   MCV 92.1 80.0 - 100.0 fL   MCH 30.6 26.0 - 34.0 pg   MCHC 33.2 30.0 - 36.0 g/dL   RDW 56.3 87.5 - 64.3 %  Platelets 227 150 - 400 K/uL   nRBC 0.0 0.0 - 0.2 %    Comment: Performed at [facility], [address], [place], [place] [address]  Magnesium     Status: None   Collection Time: 03/21/23  5:11 AM  Result Value Ref Range   Magnesium 1.7 1.7 - 2.4 mg/dL    Comment: Performed at [facility], [address], [place], [place] [address]    Imaging / Studies: No results found.  Medications / Allergies: per chart  Antibiotics: Anti-infectives (From admission, onward)    Start     Dose/Rate Route Frequency Ordered Stop   03/20/23 2100  cefoTEtan (CEFOTAN) 2 g in sodium chloride 0.9 % 100 mL IVPB        2 g 200 mL/hr over 30 Minutes Intravenous Every 12 hours 03/20/23 1223 03/20/23 2157   03/20/23 1400  neomycin (MYCIFRADIN) tablet 1,000 mg  Status:  Discontinued       Placed in "And" Linked Group   1,000 mg Oral 3 times per day 03/20/23 0730 03/20/23 0741   03/20/23 1400  metroNIDAZOLE (FLAGYL) tablet 1,000 mg  Status:  Discontinued       Placed in "And" Linked Group   1,000 mg Oral 3 times per day 03/20/23 0730 03/20/23 0741   03/20/23 0745  cefoTEtan (CEFOTAN) 2 g in sodium chloride 0.9 % 100 mL IVPB        2 g 200 mL/hr over 30 Minutes Intravenous On call to O.R. 03/20/23 0730 03/20/23 0919         Note: Portions of this report may have been transcribed using voice recognition  software. Every effort was made to ensure accuracy; however, inadvertent computerized transcription errors may be present.   Any transcriptional errors that result from this process are unintentional.    Ardeth Sportsman, MD, FACS, MASCRS Esophageal, Gastrointestinal & Colorectal Surgery Robotic and Minimally Invasive Surgery  [facility] [address] [place], [place] [address] [phone] Fax [phone] Main  CONTACT INFORMATION: Weekday (9AM-5PM): Call [facility] main office at [phone] Weeknight (5PM-9AM) or Weekend/Holiday: Check EPIC "Web Links" tab & use "AMION" (password " [email or web address]") for General Surgery [facility] coverage  Please, DO NOT use SecureChat  (it is not reliable communication to reach operating surgeons & will lead to a delay in care).   Epic staff messaging available for outptient concerns needing 1-2 business day response.      03/21/2023  6:30 AM

## 2023-03-21 NOTE — TOC Initial Note (Signed)
Transition of Care [facility]) - Initial/Assessment Note    Patient Details  Name: Sarah Meadows MRN: [id number] Date of Birth: [birth year]  Transition of Care [facility]) CM/SW Contact:    Adrian Prows, RN Phone Number: 03/21/2023, 12:03 PM  Clinical Narrative:                 TOC for d/c planning; spoke w/ pt in room; pt says she lives at home; she plans to return at d/c; pt verified insurance/PCP; she has transportation; pt says she will have difficulty paying for food, housing, and utilities since she is currently unable to work; pt says she does not have DME, HH services, or home oxygen; she agrees to receive resources for social services and financial assistance; resources placed in d/c instructions; copies of resources also given to pt; she will make appts at agencies of choice; TOC will follow.  Expected Discharge Plan: Home/Self Care Barriers to Discharge: Continued Medical Work up   Patient Goals and CMS Choice Patient states their goals for this hospitalization and ongoing recovery are:: home CMS Medicare.gov Compare Post Acute Care list provided to:: Patient Choice offered to / list presented to : Patient      Expected Discharge Plan and Services   Discharge Planning Services: CM Consult   Living arrangements for the past 2 months: Single Family Home                                      Prior Living Arrangements/Services Living arrangements for the past 2 months: Single Family Home Lives with:: Adult Children Patient language and need for interpreter reviewed:: Yes Do you feel safe going back to the place where you live?: Yes      Need for Family Participation in Patient Care: Yes (Comment) Care giver support system in place?: Yes (comment) Current home services:  (n/a) Criminal Activity/Legal Involvement Pertinent to Current Situation/Hospitalization: No - Comment as needed  Activities of Daily Living   ADL Screening (condition at time of  admission) Independently performs ADLs?: Yes (appropriate for developmental age) Is the patient deaf or have difficulty hearing?: No Does the patient have difficulty seeing, even when wearing glasses/contacts?: No Does the patient have difficulty concentrating, remembering, or making decisions?: No  Permission Sought/Granted Permission sought to share information with : Case Manager Permission granted to share information with : Yes, Verbal Permission Granted  Share Information with NAME: Case Manager     Permission granted to share info w Relationship: [place] (dtr) [phone]     Emotional Assessment Appearance:: Appears stated age Attitude/Demeanor/Rapport: Gracious Affect (typically observed): Accepting Orientation: : Oriented to Self, Oriented to Place, Oriented to  Time, Oriented to Situation Alcohol / Substance Use: Not Applicable Psych Involvement: No (comment)  Admission diagnosis:  Crohn's disease of ileum with intestinal obstruction (HCC) [K50.012] Patient Active Problem List   Diagnosis Date Noted   Crohn's disease of ileum with intestinal obstruction (HCC) 03/20/2023   Crohn's ileitis, with intestinal obstruction (HCC) 11/28/2022   Generalized anxiety disorder 10/13/2022   Hyperlipidemia 10/13/2022   Type 2 diabetes mellitus (HCC) 10/13/2022   Bilateral sacroiliitis (HCC) 10/07/2018   Controlled substance agreement signed 08/01/2018   Restless leg 04/01/2018   History of diverticulitis 03/28/2018   Obesity (BMI 30.0-34.9) 03/28/2018   Crohn's disease (HCC) 04/11/2017   PCP:  Henry Russel, MD Pharmacy:   [id number] - [place], [place] - [address] [place] [address] Phone: [phone] Fax: [phone]     Social Determinants of Health (SDOH) Social History: SDOH Screenings   Food Insecurity: No Food Insecurity (03/21/2023)  Housing: Low Risk  (03/21/2023)  Transportation Needs:  No Transportation Needs (03/21/2023)  Utilities: Not At Risk (03/21/2023)  Depression (PHQ2-9): Low Risk  (02/17/2020)  Financial Resource Strain: Unknown (12/25/2020)   [facility]  Physical Activity: Insufficiently Active (12/25/2020)   Received from [facility], [facility]  Social Connections: Moderately Integrated (02/25/2022)   Received from [facility], [facility]  Stress: No Stress Concern Present (12/25/2020)   [facility], [facility]  Tobacco Use: Low Risk  (03/20/2023)   SDOH Interventions: Food Insecurity Interventions: Intervention Not Indicated, Inpatient TOC Housing Interventions: Inpatient TOC Transportation Interventions: Intervention Not Indicated, Inpatient TOC Utilities Interventions: Intervention Not Indicated, Inpatient TOC   Readmission Risk Interventions     No data to display

## 2023-03-21 NOTE — Plan of Care (Signed)

## 2023-03-22 NOTE — Discharge Summary (Signed)
Physician Discharge Summary    Sarah Meadows MRN: [id number] DOB/AGE: Dec 12, 1965 = 57 y.o.  Patient Care Team: Henry Russel, MD as PCP - General (Family Medicine) Karie Soda, MD as Consulting Physician (General Surgery) Armbruster, Willaim Rayas, MD as Consulting Physician (Gastroenterology)  Admit date: 03/20/2023  Discharge date: 03/22/2023  Hospital Stay = 2 days    Discharge Diagnoses:  Principal Problem:   Crohn's ileitis, with intestinal obstruction (HCC) Active Problems:   Crohn's disease (HCC)   Obesity (BMI 30.0-34.9)   Generalized anxiety disorder   Type 2 diabetes mellitus (HCC)   Crohn's disease of ileum with intestinal obstruction (HCC)   2 Days Post-Op  03/20/2023  POST-OPERATIVE DIAGNOSIS:   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  Postoperatively, the patient gradually mobilized and advanced to a solid diet.  Pain and other symptoms were treated aggressively.    By the time of discharge, the patient was walking well the hallways, eating food, having flatus.  Pain was well-controlled on an oral medications.  Based on meeting discharge criteria and continuing to recover, I felt it was safe for the patient to be discharged from the hospital to further recover with close followup. Postoperative recommendations were discussed in detail.  They are written as well.  Discharged Condition: good  Discharge Exam: Blood pressure 99/60, pulse 88, temperature 98.1 F (36.7 C), temperature source Oral, resp. rate 17, height 5\' 1"  (1.549 m), weight 78.1 kg, SpO2 99%.  General: Pt awake/alert/oriented x4 in No acute distress Eyes: PERRL, normal EOM.  Sclera clear.  No icterus Neuro: CN II-XII intact  w/o focal sensory/motor deficits. Lymph: No head/neck/groin lymphadenopathy Psych:  No delerium/psychosis/paranoia HENT: Normocephalic, Mucus membranes moist.  No thrush Neck: Supple, No tracheal deviation Chest: No chest wall pain w good excursion CV:  Pulses intact.  Regular rhythm MS: Normal AROM mjr joints.  No obvious deformity Abdomen: Soft.  Nondistended.  Nontender.  No evidence of peritonitis.  No incarcerated hernias. Ext:  SCDs BLE.  No mjr edema.  No cyanosis Skin: No petechiae / purpura   Disposition:    Follow-up Information     Karie Soda, MD Follow up on 04/23/2023.   Specialties: General Surgery, Colon and Rectal Surgery Why: To follow up after your operation Contact information: [address] [place] [address] [phone]                 Discharge disposition: 01-Home or Self Care         Allergies as of 03/22/2023       Reactions   Adalimumab Rash, Other (See Comments)   Humira   Nsaids Other (See Comments)   Crohn's = NO NSAIDs        Medication List     TAKE these medications    acidophilus Caps capsule Take 1 capsule by mouth daily.   augmented betamethasone dipropionate 0.05 % ointment Commonly known as: DIPROLENE-AF Apply 1 Application topically daily as needed (for irritation- affected areas).   blood glucose meter kit and supplies Dispense based on patient and insurance preference. Use up to four times daily as directed. (FOR ICD-10 E10.9, E11.9).   clobetasol cream 0.05 % Commonly known as: TEMOVATE Apply 1 application  topically 2 (two)  times daily.   Coenzyme Q10 200 MG capsule Take 200 mg by mouth daily.   cyanocobalamin 1000 MCG tablet Take 1,000 mcg by mouth daily.   dicyclomine 10 MG capsule Commonly known as: BENTYL Take 1 capsule (10 mg total) by mouth every 8 (eight) hours as needed (abdominal cramping or pain).   hydroquinone 4 % cream Apply 1 Application topically See admin  instructions. Apply to affected areas 2 times a day   hydroxychloroquine 200 MG tablet Commonly known as: PLAQUENIL Take 200 mg by mouth daily.   Mounjaro 10 MG/0.5ML Pen Generic drug: tirzepatide Inject 10 mg into the skin every Wednesday.   multivitamin with minerals Tabs tablet Take 1 tablet by mouth daily.   nystatin cream Commonly known as: MYCOSTATIN Apply 1 Application topically as needed.   ondansetron 4 MG disintegrating tablet Commonly known as: ZOFRAN-ODT Take 1 tablet (4 mg total) by mouth every 6 (six) hours as needed for nausea or vomiting. What changed: reasons to take this   Plenvu 140 g Solr Generic drug: PEG-KCl-NaCl-NaSulf-Na Asc-C Take 1 kit by mouth as directed. Use coupon: BIN: [id number] PNC: CNRX Group: [id number] ID: [id number]   promethazine 25 MG tablet Commonly known as: PHENERGAN Take 25 mg by mouth every 4 (four) hours as needed for nausea or vomiting.   rizatriptan 10 MG tablet Commonly known as: MAXALT Take 10 mg by mouth as needed for migraine.   Skyrizi 360 MG/2.4ML Soct Generic drug: Risankizumab-rzaa Inject 1 Pen into the skin every 8 (eight) weeks. What changed: how much to take   sucralfate 1 g tablet Commonly known as: CARAFATE TAKE 1 TABLET BY MOUTH EVERY 8 HOURS AS NEEDED. SLOWLY DISSOLVE TABLET IN 1 TABLESPOON OF DISTILLED WATER PRIOR TO INGESTING   traMADol 50 MG tablet Commonly known as: ULTRAM Take 1-2 tablets (50-100 mg total) by mouth every 6 (six) hours as needed for moderate pain (pain score 4-6).   triamcinolone ointment 0.1 % Commonly known as: KENALOG Apply 1 Application topically 2 (two) times daily as needed (for itching).   Vitamin D3 1000 units Caps Take 1,000 Units by mouth daily.        Significant Diagnostic Studies:  Results for orders placed or performed during the hospital encounter of 03/20/23 (from the past 72 hour(s))  ABO/Rh     Status: None   Collection Time: 03/20/23  7:50 AM  Result Value  Ref Range   ABO/RH(D)      A POS Performed at [facility], [address], [place], [place] [address]   Glucose, capillary     Status: None   Collection Time: 03/20/23  7:58 AM  Result Value Ref Range   Glucose-Capillary 82 70 - 99 mg/dL    Comment: Glucose reference range applies only to samples taken after fasting for at least 8 hours.   Comment 1 Notify RN    Comment 2 Document in Chart   Glucose, capillary     Status: Abnormal   Collection Time: 03/20/23 11:41 AM  Result Value Ref Range   Glucose-Capillary 133 (H) 70 - 99 mg/dL    Comment: Glucose reference range applies only to samples taken after fasting for at least 8 hours.  Basic metabolic panel     Status: Abnormal   Collection Time: 03/21/23  5:11 AM  Result Value Ref Range   Sodium 137 135 - 145 mmol/L   Potassium 4.1 3.5 - 5.1 mmol/L   Chloride 107 98 - 111 mmol/L  CO2 24 22 - 32 mmol/L   Glucose, Bld 106 (H) 70 - 99 mg/dL    Comment: Glucose reference range applies only to samples taken after fasting for at least 8 hours.   BUN 8 6 - 20 mg/dL   Creatinine, Ser 1.61 0.44 - 1.00 mg/dL   Calcium 8.9 8.9 - 09.6 mg/dL   GFR, Estimated >04 >54 mL/min    Comment: (NOTE) Calculated using the CKD-EPI Creatinine Equation (2021)    Anion gap 6 5 - 15    Comment: Performed at [facility], [address], [place], [place] [address]  CBC     Status: Abnormal   Collection Time: 03/21/23  5:11 AM  Result Value Ref Range   WBC 10.2 4.0 - 10.5 K/uL   RBC 3.66 (L) 3.87 - 5.11 MIL/uL   Hemoglobin 11.2 (L) 12.0 - 15.0 g/dL   HCT 91.4 (L) 78.2 - 95.6 %   MCV 92.1 80.0 - 100.0 fL   MCH 30.6 26.0 - 34.0 pg   MCHC 33.2 30.0 - 36.0 g/dL   RDW 21.3 08.6 - 57.8 %   Platelets 227 150 - 400 K/uL   nRBC 0.0 0.0 - 0.2 %    Comment: Performed at [facility], [address], [place], [place] [address]  Magnesium     Status: None   Collection Time: 03/21/23  5:11 AM  Result  Value Ref Range   Magnesium 1.7 1.7 - 2.4 mg/dL    Comment: Performed at [facility], [address] [place], [place] [address]    No results found.  Past Medical History:  Diagnosis Date   Anemia    Arthritis    Crohn's disease (HCC) 2006   Crohn's disease of both small and large intestine (HCC)    Diabetes (HCC)    Type 2   Headache    Hyperlipidemia 01/2020   Lichen planus    Restless leg syndrome    Sacroiliac inflammation (HCC)     Past Surgical History:  Procedure Laterality Date   CESAREAN SECTION     x 2   CHOLECYSTECTOMY     COLONOSCOPY  04/2018    Social History   Socioeconomic History   Marital status: Single    Spouse name: Not on file   Number of children: 4   Years of education: Not on file   Highest education level: Not on file  Occupational History   Not on file  Tobacco Use   Smoking status: Never   Smokeless tobacco: Never  Vaping Use   Vaping status: Never Used  Substance and Sexual Activity   Alcohol use: Yes    Comment: Rare   Drug use: Never   Sexual activity: Not on file  Other Topics Concern   Not on file  Social History Narrative   Not on file   Social Determinants of Health   Financial Resource Strain: Unknown (12/25/2020)   Received from [facility], [facility]   Overall Financial Resource Strain (CARDIA)    Difficulty of Paying Living Expenses: Patient declined  Food Insecurity: No Food Insecurity (03/21/2023)   Hunger Vital Sign    Worried About Running Out of Food in the Last Year: Never true    Ran Out of Food in the Last Year: Never true  Transportation Needs: No Transportation Needs (03/21/2023)   PRAPARE - Administrator, Civil Service (Medical): No    Lack of Transportation (Non-Medical):  No  Physical Activity: Insufficiently Active (12/25/2020)   Received from [facility], [facility]   Exercise Vital Sign    Days of Exercise per Week: 2 days    Minutes of Exercise per  Session: 30 min  Stress: No Stress Concern Present (12/25/2020)   Received from [facility], [facility] - Occupational Stress Questionnaire    Feeling of Stress : Only a little  Social Connections: Moderately Integrated (02/25/2022)   Received from [facility], [facility]   Social Network    How would you rate your social network (family, work, friends)?: Adequate participation with social networks  Intimate Partner Violence: Not At Risk (03/21/2023)   Humiliation, Afraid, Rape, and Kick questionnaire    Fear of Current or Ex-Partner: No    Emotionally Abused: No    Physically Abused: No    Sexually Abused: No    Family History  Problem Relation Age of Onset   Diabetes Mother    Blindness Father    Dementia Father    Diabetes Father    Crohn's disease Son    Colon cancer Neg Hx    Pancreatic cancer Neg Hx    Stomach cancer Neg Hx    Colon polyps Neg Hx    Esophageal cancer Neg Hx    Rectal cancer Neg Hx     Current Facility-Administered Medications  Medication Dose Route Frequency Provider Last Rate Last Admin   0.9 %  sodium chloride infusion  250 mL Intravenous PRN Karie Soda, MD       acetaminophen (TYLENOL) tablet 1,000 mg  1,000 mg Oral Trecia Rogers, MD   1,000 mg at 03/22/23 0552   acidophilus (RISAQUAD) capsule 1 capsule  1 capsule Oral Daily Karie Soda, MD   1 capsule at 03/21/23 0927   alum & mag hydroxide-simeth (MAALOX/MYLANTA) 200-200-20 MG/5ML suspension 30 mL  30 mL Oral Q6H PRN Karie Soda, MD       alvimopan (ENTEREG) capsule 12 mg  12 mg Oral BID Karie Soda, MD   12 mg at 03/21/23 9147   bismuth subsalicylate (PEPTO BISMOL) 262 MG/15ML suspension 30 mL  30 mL Oral Q8H PRN Karie Soda, MD       cyanocobalamin (VITAMIN B12) tablet 1,000 mcg  1,000 mcg Oral Daily Karie Soda, MD   1,000 mcg at 03/21/23 8295   dicyclomine (BENTYL) capsule 10 mg  10 mg Oral Q8H PRN Karie Soda, MD        diphenhydrAMINE (BENADRYL) 12.5 MG/5ML elixir 12.5 mg  12.5 mg Oral Q6H PRN Karie Soda, MD       Or   diphenhydrAMINE (BENADRYL) injection 12.5 mg  12.5 mg Intravenous Q6H PRN Karie Soda, MD       enoxaparin (LOVENOX) injection 40 mg  40 mg Subcutaneous Q24H Karie Soda, MD   40 mg at 03/21/23 6213   feeding supplement (ENSURE SURGERY) liquid 237 mL  237 mL Oral BID BM Karie Soda, MD   237 mL at 03/21/23 1000   gabapentin (NEURONTIN) capsule 200 mg  200 mg Oral Laurena Slimmer, MD   200 mg at 03/21/23 2013   hydrALAZINE (APRESOLINE) injection 10 mg  10 mg Intravenous Q2H PRN Karie Soda, MD       HYDROmorphone (DILAUDID) injection 0.5-2 mg  0.5-2 mg Intravenous Q4H PRN Karie Soda, MD   1 mg at 03/20/23 2130   magic mouthwash  15 mL Oral QID PRN Karie Soda, MD  melatonin tablet 3 mg  3 mg Oral QHS PRN Karie Soda, MD   3 mg at 03/20/23 2119   menthol-cetylpyridinium (CEPACOL) lozenge 3 mg  1 lozenge Oral PRN Karie Soda, MD       methocarbamol (ROBAXIN) injection 1,000 mg  1,000 mg Intravenous Q6H PRN Karie Soda, MD       methocarbamol (ROBAXIN) tablet 1,000 mg  1,000 mg Oral Q6H PRN Karie Soda, MD   1,000 mg at 03/20/23 2119   metoprolol tartrate (LOPRESSOR) injection 5 mg  5 mg Intravenous Q6H PRN Karie Soda, MD       multivitamin with minerals tablet 1 tablet  1 tablet Oral Daily Karie Soda, MD   1 tablet at 03/21/23 4098   naphazoline-glycerin (CLEAR EYES REDNESS) ophth solution 1-2 drop  1-2 drop Both Eyes QID PRN Karie Soda, MD       ondansetron [facility]) tablet 4 mg  4 mg Oral Q6H PRN Karie Soda, MD       Or   ondansetron [facility]) injection 4 mg  4 mg Intravenous Q6H PRN Karie Soda, MD       phenol (CHLORASEPTIC) mouth spray 2 spray  2 spray Mouth/Throat PRN Karie Soda, MD       polycarbophil (FIBERCON) tablet 625 mg  625 mg Oral BID Karie Soda, MD   625 mg at 03/21/23 2012   prochlorperazine (COMPAZINE) tablet 10 mg  10 mg Oral Q6H  PRN Karie Soda, MD       Or   prochlorperazine (COMPAZINE) injection 5-10 mg  5-10 mg Intravenous Q6H PRN Karie Soda, MD       simethicone (MYLICON) chewable tablet 40 mg  40 mg Oral Q6H PRN Karie Soda, MD   40 mg at 03/21/23 1152   sodium chloride (OCEAN) 0.65 % nasal spray 1-2 spray  1-2 spray Each Nare Q6H PRN Karie Soda, MD       sodium chloride flush (NS) 0.9 % injection 3 mL  3 mL Intravenous Catha Gosselin, MD   3 mL at 03/21/23 2014   sodium chloride flush (NS) 0.9 % injection 3 mL  3 mL Intravenous PRN Karie Soda, MD       SUMAtriptan (IMITREX) tablet 100 mg  100 mg Oral Q2H PRN Karie Soda, MD       traMADol Janean Sark) tablet 50-100 mg  50-100 mg Oral Q6H PRN Karie Soda, MD   50 mg at 03/22/23 1191     Allergies  Allergen Reactions   Adalimumab Rash and Other (See Comments)    Humira   Nsaids Other (See Comments)    Crohn's = NO NSAIDs    Signed:   Ardeth Sportsman, MD, FACS, MASCRS Esophageal, Gastrointestinal & Colorectal Surgery Robotic and Minimally Invasive Surgery  [facility] [address] [place], [place] [address] [phone] Fax [phone] Main  CONTACT INFORMATION: Weekday (9AM-5PM): Call [facility] main office at [phone] Weeknight (5PM-9AM) or Weekend/Holiday: Check EPIC "Web Links" tab & use "AMION" (password " [email or web address]") for General Surgery [facility] coverage  Please, DO NOT use SecureChat  (it is not reliable communication to reach operating surgeons & will lead to a delay in care).   Epic staff messaging available for outptient concerns needing 1-2 business day response.      03/22/2023, 7:41 AM

## 2023-03-22 NOTE — TOC Transition Note (Signed)
Transition of Care [facility]) - CM/SW Discharge Note   Patient Details  Name: Sarah Meadows MRN: [id number] Date of Birth: 1965-11-10  [facility] CM/SW Contact:  Adrian Prows, RN Phone Number: 03/22/2023, 9:04 AM   Clinical Narrative:    D/C orders received; no TOC needs.   Final next level of care: Home/Self Care Barriers to Discharge: No Barriers Identified   Patient Goals and CMS Choice CMS Medicare.gov Compare Post Acute Care list provided to:: Patient Choice offered to / list presented to : Patient  Discharge Placement                         Discharge Plan and Services Additional resources added to the After Visit Summary for     Discharge Planning Services: CM Consult                                 Social Determinants of Health (SDOH) Interventions SDOH Screenings   Food Insecurity: No Food Insecurity (03/21/2023)  Housing: Low Risk  (03/21/2023)  Transportation Needs: No Transportation Needs (03/21/2023)  Utilities: Not At Risk (03/21/2023)  Depression (PHQ2-9): Low Risk  (02/17/2020)  Financial Resource Strain: Unknown (12/25/2020)   Received from [facility], [facility]  Physical Activity: Insufficiently Active (12/25/2020)   Received from [facility], [facility]  Social Connections: Moderately Integrated (02/25/2022)   Received from [facility], [facility]  Stress: No Stress Concern Present (12/25/2020)   Received from [facility], [facility]  Tobacco Use: Low Risk  (03/20/2023)     Readmission Risk Interventions     No data to display

## 2023-03-22 NOTE — Progress Notes (Signed)
Discharge instructions given to patient and all questions were answered.  

## 2023-03-25 LAB — SURGICAL PATHOLOGY

## 2023-04-23 ENCOUNTER — Other Ambulatory Visit: Payer: Self-pay | Admitting: Surgery

## 2023-04-23 DIAGNOSIS — R109 Unspecified abdominal pain: Secondary | ICD-10-CM

## 2023-04-24 ENCOUNTER — Telehealth: Payer: Self-pay | Admitting: Gastroenterology

## 2023-04-24 NOTE — Telephone Encounter (Signed)
 Called and spoke with patient. Pt mentioned that Dr. Sheldon also ordered stool studies as well, patient plans to drop off sample tomorrow. Pt is aware that Dr. Leigh will further advise once CT results return. Pt verbalized understanding and had no concerns at the end of the call.

## 2023-04-24 NOTE — Telephone Encounter (Signed)
 Inbound call from patient stating she needed to make an appointment for issues with crohn's. Patient was scheduled for 1/21 at 2:30 with Deanna May. Patient is requesting a call to discuss is she can start her Nyra Capes. Please advise.

## 2023-04-24 NOTE — Telephone Encounter (Signed)
 Called and spoke with patient. Pt reports that she had surgery with Dr. Sheldon on 03/20/23 and is now experiencing some complications. Pt has had persistent (L) sided abdominal pain and intermittent fever (did not check with thermometer) since the first week of January. Pt reports that her bowel habits are worse, she uses Imodium  PRN when she has loose stools/diarrhea. Dr. Sheldon has ordered a CT scan, she is scheduled on 04/26/23. Labs drawn yesterday, results attached below. Patient's last dose of Skyrizi  was in October, Dr. Sheldon has advised that you provide recommendation for when patient should resume Skyrizi . Pt is scheduled for a follow up with Deanna May, NP on 05/07/23 at 2:30 pm. Please advise, thanks.

## 2023-04-24 NOTE — Telephone Encounter (Signed)
 Thanks Jefferson - her labs look normal which is reassuring. If she is having diarrhea let's check a C diff PCR if she can come to the lab to collect / submit. Otherwise, agree with CT scan first. If nothing concerning on the CT we can resume Skyrizi . Can you send me a reminder to look at her CT for 1/10 and I will provide further recommendations on the Skyrizi  at that time. thanks

## 2023-04-26 ENCOUNTER — Inpatient Hospital Stay: Admission: RE | Admit: 2023-04-26 | Payer: BC Managed Care – PPO | Source: Ambulatory Visit

## 2023-04-29 ENCOUNTER — Ambulatory Visit
Admission: RE | Admit: 2023-04-29 | Discharge: 2023-04-29 | Disposition: A | Discharge status: Home / Self Care | Payer: BC Managed Care – PPO | Source: Ambulatory Visit | Attending: Surgery | Admitting: Surgery

## 2023-04-29 DIAGNOSIS — R109 Unspecified abdominal pain: Secondary | ICD-10-CM

## 2023-04-29 MED ORDER — IOPAMIDOL (ISOVUE-300) INJECTION 61%
100.0000 mL | Freq: Once | INTRAVENOUS | Status: AC | PRN
  Administered 2023-04-29: 100 mL via INTRAVENOUS

## 2023-04-30 ENCOUNTER — Telehealth: Payer: Self-pay

## 2023-04-30 DIAGNOSIS — K50818 Crohn's disease of both small and large intestine with other complication: Secondary | ICD-10-CM

## 2023-04-30 NOTE — Telephone Encounter (Signed)
 Thanks Brooklyn ?

## 2023-04-30 NOTE — Telephone Encounter (Signed)
 Dr. Adela Lank, please see stool study results below. Ok for patient to resume Skyrizi?

## 2023-04-30 NOTE — Telephone Encounter (Signed)
 Dr. Adela Lank has reviewed CT scan.

## 2023-04-30 NOTE — Telephone Encounter (Signed)
 Thank you [place], yes she is cleared to resume [employer].  I appreciate your follow-up for this

## 2023-05-01 ENCOUNTER — Other Ambulatory Visit (INDEPENDENT_AMBULATORY_CARE_PROVIDER_SITE_OTHER): Payer: BC Managed Care – PPO

## 2023-05-01 DIAGNOSIS — K50818 Crohn's disease of both small and large intestine with other complication: Secondary | ICD-10-CM | POA: Diagnosis not present

## 2023-05-01 LAB — CBC WITH DIFFERENTIAL/PLATELET
Basophils Absolute: 0.1 10*3/uL (ref 0.0–0.1)
Basophils Relative: 1 % (ref 0.0–3.0)
Eosinophils Absolute: 0.2 10*3/uL (ref 0.0–0.7)
Eosinophils Relative: 3.4 % (ref 0.0–5.0)
HCT: 38 % (ref 36.0–46.0)
Hemoglobin: 12.5 g/dL (ref 12.0–15.0)
Lymphocytes Relative: 46 % (ref 12.0–46.0)
Lymphs Abs: 2.6 10*3/uL (ref 0.7–4.0)
MCHC: 33 g/dL (ref 30.0–36.0)
MCV: 90.6 fL (ref 78.0–100.0)
Monocytes Absolute: 0.4 10*3/uL (ref 0.1–1.0)
Monocytes Relative: 7.7 % (ref 3.0–12.0)
Neutro Abs: 2.3 10*3/uL (ref 1.4–7.7)
Neutrophils Relative %: 41.9 % — ABNORMAL LOW (ref 43.0–77.0)
Platelets: 332 10*3/uL (ref 150.0–400.0)
RBC: 4.19 Mil/uL (ref 3.87–5.11)
RDW: 14.9 % (ref 11.5–15.5)
WBC: 5.6 10*3/uL (ref 4.0–10.5)

## 2023-05-01 LAB — COMPREHENSIVE METABOLIC PANEL
ALT: 25 U/L (ref 0–35)
AST: 21 U/L (ref 0–37)
Albumin: 4.3 g/dL (ref 3.5–5.2)
Alkaline Phosphatase: 80 U/L (ref 39–117)
BUN: 12 mg/dL (ref 6–23)
CO2: 28 meq/L (ref 19–32)
Calcium: 9.3 mg/dL (ref 8.4–10.5)
Chloride: 106 meq/L (ref 96–112)
Creatinine, Ser: 0.72 mg/dL (ref 0.40–1.20)
GFR: 93.36 mL/min (ref >60.00)
Glucose, Bld: 96 mg/dL (ref 70–99)
Potassium: 3.4 meq/L — ABNORMAL LOW (ref 3.5–5.1)
Sodium: 141 meq/L (ref 135–145)
Total Bilirubin: 0.4 mg/dL (ref 0.2–1.2)
Total Protein: 8.1 g/dL (ref 6.0–8.3)

## 2023-05-01 MED ORDER — CHOLESTYRAMINE 4 G PO PACK: 4.0000 g | PACK | Freq: Every day | ORAL | 0 refills | Status: DC

## 2023-05-01 MED ORDER — DICYCLOMINE HCL 10 MG PO CAPS: 10.0000 mg | ORAL_CAPSULE | Freq: Three times a day (TID) | ORAL | 1 refills | Status: DC

## 2023-05-01 NOTE — Addendum Note (Signed)
 Addended by: Daxtyn Rottenberg N on: 05/01/2023 01:25 PM   Modules accepted: Orders

## 2023-05-01 NOTE — Telephone Encounter (Signed)
 Called and spoke with patient to inform her that she should resume Skyrizi  at this time. Pt is very anxious right now. She is wanting to know why she is still having all of these symptoms despite having the surgery, pt thought that the surgery would fix her symptoms. Pt reports having diarrhea all of the time. Patient reports severe pain in her stomach with eating and drinking. Pt reported pain at 9/10 right now and I advised her to go to the ER since she is also unable to tolerate liquid or solid foods. Pt states that she has been in pain since about a week after surgery, pt has become worse. Pt tried Imodium  2 tables up to 4x a day with no relief. Patient wonders if "they removed the correct part of her colon". Pt was very emotional over the phone and upset. Pt denies any fever or chills, just states that she is cold all of the time, had to bring an extra heater in her room. Pt denies any blood loss or rectal bleeding. I told pt that she is likely deficient in electrolytes since she is not eating or drinking. Patient was advised to resume Skyrizi , may take a couple of doses before she notices a significant change. I told pt that she may be having a flare in another part of her colon. Pt is overwhelmed with symptoms. Patient would like some advice. Dr. General Kenner , do you think you could speak with patient sometime today? Thanks

## 2023-05-01 NOTE — Telephone Encounter (Signed)
 Thanks [employer]. I called the patient.   She has had a difficult time postoperatively, had ileal resection for stricturing there.  She has new left lower quadrant pain that has been persisting now postoperatively as well as diarrhea that has been difficult to control.  Her left colon appeared inflamed/thickened on CT scan, but she just tested negative for C. difficile.  She is able to eat and drink okay keep things down but she is having a lot of loose watery stools.  She has not been functioning well due to the symptoms.  Recommend she come to the lab today for CBC and c-Met to make sure her electrolytes and kidney function are okay.  If there are any concerning changes there she may have to go to the hospital for further evaluation.  I would like to start her on dicyclomine  10 mg 3 times daily to help with her abdominal pain and loose stools and can add some cholestyramine  powder 4 g once daily to start, if you can order those for her. She plans to resume Skyrizi  soon, will get labs back first to make sure okay. She did not have any active colitis prior to her surgery. C diff would fit her constellation of findings well, again she tested negative for this. Denies fevers. I will be in touch with her with results of her labs. Thanks

## 2023-05-01 NOTE — Telephone Encounter (Signed)
 Lab orders in epic.  Prescriptions sent to pharmacy on file.

## 2023-05-06 NOTE — Progress Notes (Unsigned)
Chief Complaint: Follow-up of ileocolonic Crohn's disease.  Primary GI Doctor: Dr. Adela Lank  HPI:  58 year old female here for follow-up of ileocolonic Crohn's disease.    Crohn's disease Dx with ileocolonic Crohn's disease in 2006.  She states symptoms were apparently mild at that time and she never had any therapy for this until 2020 or so. Symptoms worsened in 2020, she had a colonoscopy at that time which revealed multiple deep ulcers in the cecum and a deformed IC valve.  Ileum could not be intubated.  Biopsies showed chronic active colitis.  CT enterography showed some wall thickening in the ileum and questionable duodenum.  At that point time she was placed on Remicade.  During this time she is also developed bilateral sacroiliitis.  Per review of the chart there was some issue with compliance with Remicade infusions.  She apparently felt worse earlier in 2021, Remicade levels were drawn and were undetectable with an extremely high antibody level as below and it was stopped.  She was transitioned to Humira and after a few doses developed a rash on her arms for which she was referred to dermatology.  She had a biopsy which showed lichen planus and it was unclear if Humira had been related to that or not.  She then stopped Humira and was referred to [place]. Last seen 12/2019. 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 Placed on Stelera at that time - she took for 4-6 months and self discontinued. Started [facility] Sept 2023. Seen by Dr. Adela Lank. Escalated to 360 mg of Skyrizi. She was experiencing ongoing intermittent abd pain. Occasional postprandial loose stools. Referred her for pneumonia vaccine, shingles vaccine, and a flu shot. She has received all of these. Colonoscopy scheduled 11/05/2022 - Overall no active inflammation appreciated, interval healing on Skirizi. Given endoscopic findings and prior MRE, suspect she has fibrotic / stricturing  disease that is causing her symptoms. Referral to colorectal surgery to discuss resection of ileal strictured disease.   Since last visit     Her main symptoms of Crohn's disease have historically been abdominal pain, postprandial in nature, as well as occasional loose stools. Recall she does have a history of C. difficile.   On 03/20/23 patient had robotic ileocolectomy with anastomosis surgery with Dr. Michaell Cowing.  OR findings per operative report " distal ileal stricture 10 cm with chronic thickening but no major inflammation. Some thickness at the ileocecal valve. Correlating with the 2 strictures noted by MR enterography and colonoscopy. No skip lesions or other abnormalities in the remaining small intestine.  No obvious colon abnormalities.     It is an isoperistaltic ileocolonic anastomosis (distal ileum to hepatic flexure) that rests in the right upper quadrant."  On 04/23/23 patient seen by Dr. Michaell Cowing for first postop visit. She expressed feeling worse with LLQ abdominal pain and diarrhea. Pathology: Active Crohn's ileitis with patchy transmural chronic inflammation inflammatory stricture. Colon not inflamed. No dysplasia. No malignancy. Margins negative.  Dr. Michaell Cowing ordered a CT scan and lab work. Labs and imaging results reviewed and listed below. 04/29/23 Cdiff negative 04/30/23 cleared to resume [employer].  05/01/23 Per Dr Lanetta Inch encounter "had ileal resection for stricturing there. She has new left lower quadrant pain that has been persisting now postoperatively as well as diarrhea that has been difficult to control. Her left colon appeared inflamed/thickened on CT scan, but she just tested negative for C. difficile. She is able to eat and drink okay keep things down  but she is having a lot of loose watery stools. She has not been functioning well due to the symptoms."   Ordered labs (listed below). Restarted Skyrizi on 05/01/2023.  Patient started on dicyclomine and cholestyramine. Patient  taking dicyclomine 10 mg TID and cholestyramine 4g once daily. Patient reports she will have up to 10-12 loose stools per day. LLQ abdominal pain that radiates across her navel, described as sharp and constant.  Patient has mild nausea, no vomiting. Consuming full liquid diet with toast. Lost 12lbs in the past 6 weeks. She tells me she stopped all her other scheduled medications due to how she is feeling. Notes pain with urination.   Prior workup:  Labs: 05/01/23 - potassium 3.4, normal lft's, normal kidney function, wbc 5.6, hgb 12.5 04/29/23 Cdiff negative 04/23/23 labs show BUN 12/creatinine 0.72, normal LFTs, hemoglobin 12.2, WBC 5, platelets 342 10/30/22- Sed rate 18, CRP 2.99, normal kidney and liver function, hgb 12.9 05/14/19 - infliximab level undetectable, antibody level of 1632   EGD 05/25/19 - small HH, erythema of the antrum, normal duodenum - Dr. Rinaldo Ratel - path negative for H pylori, shows mild chronic inactive gastritis   Colonoscopy 05/06/18 - multiple deep ulcers in the cecum, IC valve deformed, could not intubate the TI, diverticulosis noted, otherwise normal colon  - path shows moderate active colitis in IC valve and cecum, otherise normal biopsies throughout   CT enterography - 05/14/18 - wall thickening of the ileum and duodenum, bilateral sacroileitis   CT scan Feb 2021 - mild fatty liver, no active Crohn's otherwise   TPMT 10 TB negative 10/22/19 Hep testing negative B12 / iron studies okay   10/23/19 Fecal calprotectin 97 - intermediate  Colonoscopy 11/05/22  Impression:  - Stricture in the terminal ileum. Otherwise normal ileum  - interval healing on Skyrizi - Stricture in the cecum. Biopsied.  - One 6 mm polyp in the transverse colon, removed with a cold snare. Resected and retrieved.  - Diverticulosis in the transverse colon and in the left colon.  - Internal hemorrhoids.  - The examination was otherwise normal.  - Biopsies were taken with a cold forceps for  histology in the ascending colon and in the cecum. Path: Diagnosis 1. Surgical [P], colon, cecal stricture bx - CHRONIC MINIMALLY ACTIVE COLITIS - NEGATIVE FOR DYSPLASIA OR MALIGNANCY 2. Surgical [P], right colon bx - BENIGN COLONIC MUCOSA WITH NO SPECIFIC PATHOLOGIC CHANGES - NEGATIVE FOR ACUTE INFLAMMATION - NEGATIVE FOR DYSPLASIA OR MALIGNANCY 3. Surgical [P], colon, transverse, polyp (1) - POLYPOID FRAGMENT OF BENIGN COLONIC MUCOSA WITH LYMPHOID AGGREGATE   Colonoscopy 12/03/19 - The perianal and digital rectal examinations were normal. - A mild stenosis was found at the IC valve but easily traversed. Patchy inflammation characterized by erosions and erythema was found in the terminal ileum. There appeared to be another area of stricturing a few cm proximal to the valve which was not traversed. - A benign-appearing, intrinsic moderate to severe stenosis was found in the cecum near the IC valve. This was able to be traversed to visualize the cecal cap, which was otherwise normal. Mild ulceration noted at the stricture. Biopsies were taken with a cold forceps for histology. - A 4 mm polyp was found in the sigmoid colon. The polyp was sessile. The polyp was removed with a cold snare. Resection and retrieval were complete. - Multiple small-mouthed diverticula were found in the left colon. - The exam was otherwise without abnormality. No other obvious inflammation noted.  1. Surgical [P], colon, cecal stricture - CHRONIC MILDLY ACTIVE COLITIS. - NO DYSPLASIA OR MALIGNANCY. 2. Surgical [P], random colon bx - BENIGN COLONIC MUCOSA. - NO ACTIVE INFLAMMATION. - NO DYSPLASIA OR MALIGNANCY. 3. Surgical [P], colon, sigmoid, polyp - HYPERPLASTIC POLYP. - NO DYSPLASIA OR MALIGNANCY.    MRE 01/09/22: IMPRESSION: 1. Imaging findings compatible with fibrostenotic disease of the distal/terminal ileum. 2. No convincing evidence of additional sites of bowel disease involvement. 3. Large  volume of formed stool throughout the colon. 4. Left-sided colonic diverticulosis without findings of acute diverticulitis.  04/29/2023 CT Abd/pelvis IMPRESSION: Interval surgical changes with resection and primary anastomosis of distal ileum and proximal colon. No obstruction, free air or free fluid. There is wall thickening along the distal colon including the distal descending colon, sigmoid and rectum with vascular engorgement. Please correlate for an active process. With the patient's history an inflammatory process is possible. Colonic diverticula.   Wt Readings from Last 3 Encounters:  05/07/23 160 lb (72.6 kg)  03/22/23 172 lb 2.9 oz (78.1 kg)  03/08/23 152 lb 6.4 oz (69.1 kg)      Past Medical History:  Diagnosis Date   Anemia    Arthritis    Crohn's disease (HCC) 2006   Crohn's disease of both small and large intestine (HCC)    Diabetes (HCC)    Type 2   Headache    Hyperlipidemia 01/2020   Lichen planus    Restless leg syndrome    Sacroiliac inflammation (HCC)     Past Surgical History:  Procedure Laterality Date   CESAREAN SECTION     x 2   CHOLECYSTECTOMY     COLONOSCOPY  04/2018    Current Outpatient Medications  Medication Sig Dispense Refill   augmented betamethasone dipropionate (DIPROLENE-AF) 0.05 % ointment Apply 1 Application topically daily as needed (for irritation- affected areas).     blood glucose meter kit and supplies Dispense based on patient and insurance preference. Use up to four times daily as directed. (FOR ICD-10 E10.9, E11.9). 1 each 0   cholestyramine (QUESTRAN) 4 g packet Take 1 packet (4 g total) by mouth daily. 30 packet 0   clobetasol cream (TEMOVATE) 0.05 % Apply 1 application  topically 2 (two) times daily.     dicyclomine (BENTYL) 10 MG capsule Take 1 capsule (10 mg total) by mouth 3 (three) times daily before meals. 60 capsule 1   hydroquinone 4 % cream Apply 1 Application topically See admin instructions. Apply to  affected areas 2 times a day     nystatin cream (MYCOSTATIN) Apply 1 Application topically as needed.     Risankizumab-rzaa (SKYRIZI) 360 MG/2.4ML SOCT Inject 1 Pen into the skin every 8 (eight) weeks. (Patient taking differently: Inject 360 mg into the skin every 8 (eight) weeks.) 2.4 mL 6   triamcinolone ointment (KENALOG) 0.1 % Apply 1 Application topically 2 (two) times daily as needed (for itching).     acidophilus (RISAQUAD) CAPS capsule Take 1 capsule by mouth daily. (Patient not taking: Reported on 05/07/2023)     Cholecalciferol (VITAMIN D3) 1000 units CAPS Take 1,000 Units by mouth daily. (Patient not taking: Reported on 05/07/2023)     Coenzyme Q10 200 MG capsule Take 200 mg by mouth daily. (Patient not taking: Reported on 05/07/2023)     cyanocobalamin 1000 MCG tablet Take 1,000 mcg by mouth daily. (Patient not taking: Reported on 05/07/2023)     hydroxychloroquine (PLAQUENIL) 200 MG tablet Take 200 mg by mouth daily. (  Patient not taking: Reported on 05/07/2023)     MOUNJARO 10 MG/0.5ML Pen Inject 10 mg into the skin every Wednesday. (Patient not taking: Reported on 05/07/2023)     Multiple Vitamin (MULTIVITAMIN WITH MINERALS) TABS tablet Take 1 tablet by mouth daily. (Patient not taking: Reported on 05/07/2023)     ondansetron (ZOFRAN-ODT) 4 MG disintegrating tablet Take 1 tablet (4 mg total) by mouth every 6 (six) hours as needed for nausea or vomiting. (Patient not taking: Reported on 05/07/2023) 30 tablet 3   promethazine (PHENERGAN) 25 MG tablet Take 25 mg by mouth every 4 (four) hours as needed for nausea or vomiting. (Patient not taking: Reported on 05/07/2023)     rizatriptan (MAXALT) 10 MG tablet Take 10 mg by mouth as needed for migraine. (Patient not taking: Reported on 05/07/2023)     Current Facility-Administered Medications  Medication Dose Route Frequency Provider Last Rate Last Admin   dextrose 5 % solution   Intravenous Continuous Armbruster, Willaim Rayas, MD        Allergies as of  05/07/2023 - Review Complete 05/07/2023  Allergen Reaction Noted   Adalimumab Rash and Other (See Comments) 09/02/2019   Nsaids Other (See Comments) 11/28/2022    Family History  Problem Relation Age of Onset   Diabetes Mother    Blindness Father    Dementia Father    Diabetes Father    Crohn's disease Son    Colon cancer Neg Hx    Pancreatic cancer Neg Hx    Stomach cancer Neg Hx    Colon polyps Neg Hx    Esophageal cancer Neg Hx    Rectal cancer Neg Hx    Review of Systems:    Constitutional: No weight loss, fever, chills, weakness or fatigue HEENT: Eyes: No change in vision               Ears, Nose, Throat:  No change in hearing or congestion Skin: No rash or itching Cardiovascular: No chest pain, chest pressure or palpitations   Respiratory: No SOB or cough Gastrointestinal: See HPI and otherwise negative Genitourinary: No dysuria or change in urinary frequency Neurological: No headache, dizziness or syncope Musculoskeletal: No new muscle or joint pain Hematologic: No bleeding or bruising Psychiatric: No history of depression or anxiety   Physical Exam:  Vital signs: BP 120/72   Pulse 91   Ht 5\' 1"  (1.549 m)   Wt 160 lb (72.6 kg)   SpO2 99%   BMI 30.23 kg/m  Constitutional: Pleasant African American female appears to be in NAD, Well developed, Well nourished, alert and cooperative Neck:  Supple Throat: Oral cavity and pharynx without inflammation, swelling or lesion.  Respiratory: Respirations even and unlabored. Lungs clear to auscultation bilaterally.   No wheezes, crackles, or rhonchi.  Cardiovascular: Normal S1, S2. Regular rate and rhythm. No peripheral edema, cyanosis or pallor.  Gastrointestinal:  Soft, nondistended, LLQ tenderness and RLQ tenderness with palpation. No rebound or guarding. Normal bowel sounds. No appreciable masses or hepatomegaly. Rectal:  Not performed.  Msk:  Symmetrical without gross deformities. Without edema, no deformity or joint  abnormality.  Neurologic:  Alert and  oriented x4;  grossly normal neurologically.  Skin:   Dry and intact without significant lesions or rashes. Psychiatric: Oriented to person, place and time. Demonstrates good judgement and reason without abnormal affect or behaviors.  RELEVANT LABS AND IMAGING: CBC    Latest Ref Rng & Units 05/01/2023    3:04 PM 03/21/2023  5:11 AM 03/08/2023    2:01 PM  CBC  WBC 4.0 - 10.5 K/uL 5.6  10.2  5.3   Hemoglobin 12.0 - 15.0 g/dL 71.6  96.7  89.3   Hematocrit 36.0 - 46.0 % 38.0  33.7  39.4   Platelets 150.0 - 400.0 K/uL 332.0  227  253     CMP     Latest Ref Rng & Units 05/01/2023    3:04 PM 03/21/2023    5:11 AM 03/08/2023    2:01 PM  CMP  Glucose 70 - 99 mg/dL 96  810  80   BUN 6 - 23 mg/dL 12  8  13    Creatinine 0.40 - 1.20 mg/dL 1.75  1.02  5.85   Sodium 135 - 145 mEq/L 141  137  140   Potassium 3.5 - 5.1 mEq/L 3.4  4.1  3.9   Chloride 96 - 112 mEq/L 106  107  105   CO2 19 - 32 mEq/L 28  24  24    Calcium 8.4 - 10.5 mg/dL 9.3  8.9  9.6   Total Protein 6.0 - 8.3 g/dL 8.1     Total Bilirubin 0.2 - 1.2 mg/dL 0.4     Alkaline Phos 39 - 117 U/L 80     AST 0 - 37 U/L 21     ALT 0 - 35 U/L 25        Lab Results  Component Value Date   TSH 1.740 01/12/2020    Assessment: Encounter Diagnoses  Name Primary?   Crohn's disease of both small and large intestine with other complication (HCC) Yes   High risk medication use    Diarrhea, unspecified type    Lower abdominal pain     58 year old [place] female patient with history of leocolonic Crohn's disease who presents postoperatively after robotic ileocolectomy with anastomosis for chronic ileal and ileocecal strictures with c/o lower abdominal pain and diarrhea. Her left colon appeared inflamed/thickened on CT scan, but she tested negative for C. difficile. No improvement with OTC imodium initially. The she was started on dicyclomine 10mg  TID, and cholestyramine 4gm po daily without any  improvement. Discussed case with Dr. Adela Lank and will proceed with following;  Plan: -Order full GI pathogen panel and C Diff PCR diatherix  -Check fecal calprotectin - Continue dicyclomine 10 mg TID - Increase the cholestyramine to BID  -Add imodium 2 capsules titrate up to three times daily if needed, if no improvement will consider lomotil.  Thank you for the courtesy of this consult. Please call me with any questions or concerns.   Sarah Mccravy, FNP-C [facility] 05/07/2023, 3:09 PM  Cc: Sarah Russel, MD

## 2023-05-07 ENCOUNTER — Ambulatory Visit: Payer: BC Managed Care – PPO | Admitting: Gastroenterology

## 2023-05-07 ENCOUNTER — Encounter: Payer: Self-pay | Admitting: Gastroenterology

## 2023-05-07 VITALS — BP 120/72 | HR 91 | Ht 61.0 in | Wt 160.0 lb

## 2023-05-07 DIAGNOSIS — R1030 Lower abdominal pain, unspecified: Secondary | ICD-10-CM

## 2023-05-07 DIAGNOSIS — K50818 Crohn's disease of both small and large intestine with other complication: Secondary | ICD-10-CM

## 2023-05-07 DIAGNOSIS — R197 Diarrhea, unspecified: Secondary | ICD-10-CM

## 2023-05-07 DIAGNOSIS — Z79899 Other long term (current) drug therapy: Secondary | ICD-10-CM

## 2023-05-07 MED ORDER — LOPERAMIDE HCL 2 MG PO CAPS
ORAL_CAPSULE | ORAL | Status: AC
Start: 2023-05-07 — End: ?

## 2023-05-07 NOTE — Patient Instructions (Addendum)
If your blood pressure at your visit was 140/90 or greater, please contact your primary care physician to follow up on this. ______________________________________________________  If you are age 57 or older, your body mass index should be between 23-30. Your Body mass index is 30.23 kg/m. If this is out of the aforementioned range listed, please consider follow up with your Primary Care Provider.  If you are age 64 or younger, your body mass index should be between 19-25. Your Body mass index is 30.23 kg/m. If this is out of the aformentioned range listed, please consider follow up with your Primary Care Provider.  ________________________________________________________  The [facility] providers would like to encourage you to use [facility] to communicate with providers for non-urgent requests or questions.  Due to long hold times on the telephone, sending your provider a message by [facility] may be a faster and more efficient way to get a response.  Please allow 48 business hours for a response.  Please remember that this is for non-urgent requests.  _______________________________________________________  Due to recent changes in healthcare laws, you may see the results of your imaging and laboratory studies on MyChart before your provider has had a chance to review them.  We understand that in some cases there may be results that are confusing or concerning to you. Not all laboratory results come back in the same time frame and the provider may be waiting for multiple results in order to interpret others.  Please give [place] 48 hours in order for your provider to thoroughly review all the results before contacting the office for clarification of your results.   We will reach out to you regarding next steps.  Thank you for entrusting me with your care and for choosing [facility], Deanna May, NP m

## 2023-05-07 NOTE — Progress Notes (Signed)
Thank you for seeing her Sarah [place]. Unfortunately she has not been doing well status post surgical resection from Dr. Michaell Cowing.  She had significant stricturing disease that was removed. She has had some pretty significant diarrhea that been bothering her.  Left-sided colitis noted on CT and she did not have any IBD that was active at the time of her last colonoscopy.  Would do full infectious workup and repeat C. difficile just to make sure negative as certainly C. difficile could present like this.  She has resumed her Skyrizi.  Can increase cholestyramine and add Imodium or consider switching to Lomotil pending her course, hopefully stool testing back soon to ensure no infectious etiology.  Would also send fecal calprotectin

## 2023-05-07 NOTE — Addendum Note (Signed)
Addended by: Cooper Render on: 05/07/2023 05:15 PM   Modules accepted: Orders

## 2023-05-09 ENCOUNTER — Telehealth: Payer: Self-pay | Admitting: Gastroenterology

## 2023-05-09 NOTE — Telephone Encounter (Signed)
Spoke with patient today via telephone and answered all of her questions and concerns. I also emailed her medication changes we discussed for the constipation. Advised she speak to her surgeon for any other questions or concerns related to her surgery.

## 2023-05-13 ENCOUNTER — Other Ambulatory Visit: Payer: BC Managed Care – PPO

## 2023-05-14 ENCOUNTER — Other Ambulatory Visit: Payer: BC Managed Care – PPO

## 2023-05-14 DIAGNOSIS — K50818 Crohn's disease of both small and large intestine with other complication: Secondary | ICD-10-CM

## 2023-05-14 DIAGNOSIS — R197 Diarrhea, unspecified: Secondary | ICD-10-CM

## 2023-05-15 ENCOUNTER — Telehealth: Payer: Self-pay | Admitting: Gastroenterology

## 2023-05-15 ENCOUNTER — Other Ambulatory Visit: Payer: Self-pay

## 2023-05-15 DIAGNOSIS — R197 Diarrhea, unspecified: Secondary | ICD-10-CM

## 2023-05-15 DIAGNOSIS — R1030 Lower abdominal pain, unspecified: Secondary | ICD-10-CM

## 2023-05-15 NOTE — Telephone Encounter (Signed)
Dr. Michaell Cowing called stated the patient has reached out to his office, stated the patient is really upset because she has tried to reach out to our office a few times. He said he has provided  her with his recommendations but she is now needing GI care\medications. His Cell: [phone]

## 2023-05-17 LAB — CALPROTECTIN, FECAL: Calprotectin, Fecal: 115 ug/g (ref 0–120)

## 2023-05-20 ENCOUNTER — Telehealth: Payer: Self-pay

## 2023-05-20 DIAGNOSIS — K50818 Crohn's disease of both small and large intestine with other complication: Secondary | ICD-10-CM

## 2023-05-20 MED ORDER — NA SULFATE-K SULFATE-MG SULF 17.5-3.13-1.6 GM/177ML PO SOLN: 1.0000 | ORAL | 0 refills | Status: DC

## 2023-05-20 NOTE — Telephone Encounter (Signed)
Secure chat sent to April McPeak to ensure authorization is obtained prior to appt this week.

## 2023-05-20 NOTE — Telephone Encounter (Signed)
-----   Message from Benancio Deeds sent at 05/18/2023  5:41 PM EST ----- Regarding: RE: Stool results Yes if her symptoms persist I'd set her up for an exam. Would prep for full colonoscopy if she can tolerate it, would be good to look at the surgical anastomosis.   I have an opening in the [facility] in the next few weeks if she can do that?  Marine Lezotte or Bonita Quin - can you help coordinate? She needs to hold her antidiarrheal agents a few days prior to prep. Thanks ----- Message ----- From: May, Deanna J, NP Sent: 05/17/2023  10:12 AM EST To: Chrystie Nose, RN; Benancio Deeds, MD Subject: Stool results                                  Good morning,   I received her stool test results. Campy, Cdiff, Ecoli/shigella, Ecoli, Salmonella all negative. Her fecal cal protectin is normal at 115.  Dr. Adela Lank, would you like me to go ahead and get her set up for flexible sigmoidoscopy?  Deanna, NP-C

## 2023-05-20 NOTE — Telephone Encounter (Signed)
Called and spoke with patient to schedule flex sig as previously discussed with Deanna May, NP (see patient messages). Pt scheduled for flex sig in the [facility] on Wednesday, 05/22/23 at 2 pm. Pt is aware that Dr. Adela Lank has requested that she complete a full colonoscopy prep, pt is aware that she will be on clear liquids all day tomorrow. Pt knows to expect her instructions via MyChart, prep has been sent to her pharmacy. Pt is not taking Mounjaro at this time. Pt verbalized understanding and had no concerns at the end of the call.   Urgent ambulatory referral to GI in epic. SUPREP sent to local pharmacy on file. Prep instructions sent to patient via MyChart.

## 2023-05-22 ENCOUNTER — Encounter: Payer: Self-pay | Admitting: Gastroenterology

## 2023-05-22 ENCOUNTER — Ambulatory Visit (AMBULATORY_SURGERY_CENTER): Payer: BC Managed Care – PPO | Admitting: Gastroenterology

## 2023-05-22 VITALS — BP 132/82 | HR 79 | Temp 97.7°F | Resp 19 | Ht 61.0 in | Wt 154.0 lb

## 2023-05-22 DIAGNOSIS — K633 Ulcer of intestine: Secondary | ICD-10-CM | POA: Diagnosis present

## 2023-05-22 DIAGNOSIS — K50818 Crohn's disease of both small and large intestine with other complication: Secondary | ICD-10-CM

## 2023-05-22 DIAGNOSIS — R197 Diarrhea, unspecified: Secondary | ICD-10-CM | POA: Diagnosis not present

## 2023-05-22 DIAGNOSIS — Z980 Intestinal bypass and anastomosis status: Secondary | ICD-10-CM

## 2023-05-22 DIAGNOSIS — K648 Other hemorrhoids: Secondary | ICD-10-CM

## 2023-05-22 LAB — HM COLONOSCOPY

## 2023-05-22 MED ORDER — AMITRIPTYLINE HCL 25 MG PO TABS: 25.0000 mg | ORAL_TABLET | Freq: Every day | ORAL | 1 refills | Status: AC

## 2023-05-22 MED ORDER — SODIUM CHLORIDE 0.9 % IV SOLN: 500.0000 mL | Freq: Once | INTRAVENOUS | Status: DC

## 2023-05-22 NOTE — Progress Notes (Signed)
 Pt sedate, gd SR's, VSS, report to RN

## 2023-05-22 NOTE — Op Note (Addendum)
 [facility] Patient Name: Sarah Meadows Procedure Date: 05/22/2023 1:59 PM MRN: [id number] Endoscopist: Elspeth P. Leigh , MD, [id number] Age: 58 Referring MD:  Date of Birth: February 02, 1966 Gender: Female Account #: [id number] Procedure:                Colonoscopy Indications:              Disease activity assessment of Crohn's disease of                            the small bowel - history of ileocectal resection                            due to severe stricturing December 2024, now with                            persistent loose stools /                           abdominal pain. Has resumed Skyrizi . CT scan                            suggested possible colonic inflammation. Stool                            testing for infection is negative. Fecal                            calprotectin only around 100. Patient states                            cholestyramine  helped diarrhea but constipated her. Medicines:                Monitored Anesthesia Care Procedure:                Pre-Anesthesia Assessment:                           - Prior to the procedure, a History and Physical                            was performed, and patient medications and                            allergies were reviewed. The patient's tolerance of                            previous anesthesia was also reviewed. The risks                            and benefits of the procedure and the sedation                            options and risks were discussed with the patient.  All questions were answered, and informed consent                            was obtained. Prior Anticoagulants: The patient has                            taken no anticoagulant or antiplatelet agents. ASA                            Grade Assessment: II - A patient with mild systemic                            disease. After reviewing the risks and benefits,                            the patient was deemed  in satisfactory condition to                            undergo the procedure.                           After obtaining informed consent, the colonoscope                            was passed under direct vision. Throughout the                            procedure, the patient's blood pressure, pulse, and                            oxygen saturations were monitored continuously. The                            [id number]-[id number] Slim SN [id number] was introduced through                            the anus and advanced to the the terminal ileum.                            After obtaining informed consent, the colonoscope                            was passed under direct vision. Throughout the                            procedure, the patient's blood pressure, pulse, and                            oxygen saturations were monitored continuously.The                            colonoscopy was performed without difficulty. The  patient tolerated the procedure well. The quality                            of the bowel preparation was good. The surgical                            anastomosis, terminal ileum and the rectum were                            photographed. Scope In: 2:03:07 PM Scope Out: 2:14:33 PM Scope Withdrawal Time: 0 hours 7 minutes 13 seconds  Total Procedure Duration: 0 hours 11 minutes 26 seconds  Findings:                 The perianal and digital rectal examinations were                            normal.                           The terminal ileum appeared normal.                           There was evidence of a prior end-to-end                            ileo-colonic anastomosis in the ascending colon.                            This was patent and was characterized by healthy                            appearing mucosa for the most part with a few                            isolated apthous ulcerations (Rutgeert's score i1).                            Multiple small-mouthed diverticula were found in                            the transverse colon and left colon.                           Internal hemorrhoids were found during                            retroflexion. The hemorrhoids were small.                           The exam was otherwise without abnormality. NO                            colonic inflammation.  Biopsies for histology were taken with a cold                            forceps from the right colon, left colon and                            transverse colon for evaluation of microscopic                            colitis. Complications:            No immediate complications. Estimated blood loss:                            Minimal. Estimated Blood Loss:     Estimated blood loss was minimal. Impression:               - The examined portion of the ileum was normal.                           - Patent end-to-end ileo-colonic anastomosis,                            characterized by healthy appearing mucosa.                            Rutgeert's score i1.                           - Diverticulosis in the transverse colon and in the                            left colon.                           - Internal hemorrhoids.                           - The examination was otherwise normal.                           - Biopsies were taken with a cold forceps from the                            right colon, left colon and transverse colon for                            evaluation of microscopic colitis.                           Overall, NO active inflammation. Suspect post                            operative functional change causing her symptoms. Recommendation:           - Patient has a contact number available for  emergencies. The signs and symptoms of potential                            delayed complications were discussed with the                            patient. Return  to normal activities tomorrow.                            Written discharge instructions were provided to the                            patient.                           - Resume previous diet.                           - Continue present medications.                           - Await pathology results.                           - Continue Skyrizi                            - Continue cholestyramine  but can dose reduce to                            avoid constipation, will discuss with the patient                            how to do this. Resume at once daily.                           - Trial of IB gard PRN for cramping (we can give                            free samples from the office)                           - Start Elavil  25mg  q HS - should help diarrhea and                            her abdominal pain. Discussed with patient and she                            wishes to try this regimen. Elspeth P. Leigh, MD 05/22/2023 2:23:43 PM This report has been signed electronically.

## 2023-05-22 NOTE — Progress Notes (Signed)
 [facility] History and Physical   Primary Care Physician:  Jerilee Nottingham, MD   Reason for Procedure:   Crohn's disease, diarrhea  Plan:    colonoscopy     HPI: Sarah Meadows is a 58 y.o. female  here for colonoscopy to evaluate for active Crohn's disease and for persistent diarrhea. She had ileocolonic resection in early December. Now back on Skyrizi  but having persistent diarrhea. Some ? inflammation noted on her colon on CT Scan.   Colonoscopy to further evaluate. Otherwise feels well without any cardiopulmonary symptoms.   I have discussed risks / benefits of anesthesia and endoscopic procedure with Sarah Meadows and they wish to proceed with the exams as outlined today.    Past Medical History:  Diagnosis Date   Anemia    Arthritis    Crohn's disease (HCC) 2006   Crohn's disease of both small and large intestine (HCC)    Diabetes (HCC)    Type 2   Headache    Hyperlipidemia 01/2020   Lichen planus    Restless leg syndrome    Sacroiliac inflammation (HCC)     Past Surgical History:  Procedure Laterality Date   CESAREAN SECTION     x 2   CHOLECYSTECTOMY     COLONOSCOPY  04/2018    Prior to Admission medications   Medication Sig Start Date End Date Taking? Authorizing Provider  cholestyramine  (QUESTRAN ) 4 g packet Take 1 packet (4 g total) by mouth daily. 05/01/23  Yes Rajohn Henery, Elspeth SQUIBB, MD  loperamide  (IMODIUM ) 2 MG capsule Take 2 capsules by mouth, up to three times daily as needed 05/07/23  Yes May, Deanna J, NP  acidophilus (RISAQUAD) CAPS capsule Take 1 capsule by mouth daily. Patient not taking: Reported on 05/07/2023    [provider]  augmented betamethasone dipropionate (DIPROLENE-AF) 0.05 % ointment Apply 1 Application topically daily as needed (for irritation- affected areas).    [provider]  blood glucose meter kit and supplies Dispense based on patient and insurance preference. Use up to four times daily as  directed. (FOR ICD-10 E10.9, E11.9). 02/17/20   Stroud, Natalie M, FNP  Cholecalciferol (VITAMIN D3) 1000 units CAPS Take 1,000 Units by mouth daily. Patient not taking: Reported on 05/07/2023    [provider]  clobetasol  cream (TEMOVATE ) 0.05 % Apply 1 application  topically 2 (two) times daily.    [provider]  Coenzyme Q10 200 MG capsule Take 200 mg by mouth daily. Patient not taking: Reported on 05/07/2023 05/22/22   [provider]  cyanocobalamin  1000 MCG tablet Take 1,000 mcg by mouth daily. Patient not taking: Reported on 05/07/2023 05/22/22   [provider]  dicyclomine  (BENTYL ) 10 MG capsule Take 1 capsule (10 mg total) by mouth 3 (three) times daily before meals. 05/01/23   Damyah Gugel, Elspeth SQUIBB, MD  hydroquinone 4 % cream Apply 1 Application topically See admin instructions. Apply to affected areas 2 times a day 09/05/22   [provider]  hydroxychloroquine (PLAQUENIL) 200 MG tablet Take 200 mg by mouth daily. Patient not taking: Reported on 05/07/2023 08/01/22   [provider]  MOUNJARO  10 MG/0.5ML Pen Inject 10 mg into the skin every Wednesday. Patient not taking: Reported on 05/07/2023 12/05/21   [provider]  Multiple Vitamin (MULTIVITAMIN WITH MINERALS) TABS tablet Take 1 tablet by mouth daily. Patient not taking: Reported on 05/07/2023    [provider]  nystatin cream (MYCOSTATIN) Apply 1 Application topically as needed. 04/03/22  [provider]  ondansetron  (ZOFRAN -ODT) 4 MG disintegrating tablet Take 1 tablet (4 mg total) by mouth every 6 (six) hours as needed for nausea or vomiting. Patient not taking: Reported on 05/07/2023 12/21/21   Leigh Elspeth SQUIBB, MD  promethazine  (PHENERGAN ) 25 MG tablet Take 25 mg by mouth every 4 (four) hours as needed for nausea or vomiting. Patient not taking: Reported on 05/07/2023    [provider]  Risankizumab -rzaa (SKYRIZI ) 360 MG/2.4ML SOCT Inject 1 Pen  into the skin every 8 (eight) weeks. Patient taking differently: Inject 360 mg into the skin every 8 (eight) weeks. 07/27/22   Umer Harig, Elspeth SQUIBB, MD  rizatriptan (MAXALT) 10 MG tablet Take 10 mg by mouth as needed for migraine. Patient not taking: Reported on 05/07/2023    [provider]  triamcinolone ointment (KENALOG) 0.1 % Apply 1 Application topically 2 (two) times daily as needed (for itching).    [provider]    Current Outpatient Medications  Medication Sig Dispense Refill   cholestyramine  (QUESTRAN ) 4 g packet Take 1 packet (4 g total) by mouth daily. 30 packet 0   loperamide  (IMODIUM ) 2 MG capsule Take 2 capsules by mouth, up to three times daily as needed     acidophilus (RISAQUAD) CAPS capsule Take 1 capsule by mouth daily. (Patient not taking: Reported on 05/07/2023)     augmented betamethasone dipropionate (DIPROLENE-AF) 0.05 % ointment Apply 1 Application topically daily as needed (for irritation- affected areas).     blood glucose meter kit and supplies Dispense based on patient and insurance preference. Use up to four times daily as directed. (FOR ICD-10 E10.9, E11.9). 1 each 0   Cholecalciferol (VITAMIN D3) 1000 units CAPS Take 1,000 Units by mouth daily. (Patient not taking: Reported on 05/07/2023)     clobetasol  cream (TEMOVATE ) 0.05 % Apply 1 application  topically 2 (two) times daily.     Coenzyme Q10 200 MG capsule Take 200 mg by mouth daily. (Patient not taking: Reported on 05/07/2023)     cyanocobalamin  1000 MCG tablet Take 1,000 mcg by mouth daily. (Patient not taking: Reported on 05/07/2023)     dicyclomine  (BENTYL ) 10 MG capsule Take 1 capsule (10 mg total) by mouth 3 (three) times daily before meals. 60 capsule 1   hydroquinone 4 % cream Apply 1 Application topically See admin instructions. Apply to affected areas 2 times a day     hydroxychloroquine (PLAQUENIL) 200 MG tablet Take 200 mg by mouth daily. (Patient not taking: Reported on 05/07/2023)      MOUNJARO  10 MG/0.5ML Pen Inject 10 mg into the skin every Wednesday. (Patient not taking: Reported on 05/07/2023)     Multiple Vitamin (MULTIVITAMIN WITH MINERALS) TABS tablet Take 1 tablet by mouth daily. (Patient not taking: Reported on 05/07/2023)     nystatin cream (MYCOSTATIN) Apply 1 Application topically as needed.     ondansetron  (ZOFRAN -ODT) 4 MG disintegrating tablet Take 1 tablet (4 mg total) by mouth every 6 (six) hours as needed for nausea or vomiting. (Patient not taking: Reported on 05/07/2023) 30 tablet 3   promethazine  (PHENERGAN ) 25 MG tablet Take 25 mg by mouth every 4 (four) hours as needed for nausea or vomiting. (Patient not taking: Reported on 05/07/2023)     Risankizumab -rzaa (SKYRIZI ) 360 MG/2.4ML SOCT Inject 1 Pen into the skin every 8 (eight) weeks. (Patient taking differently: Inject 360 mg into the skin every 8 (eight) weeks.) 2.4 mL 6   rizatriptan (MAXALT) 10 MG tablet Take 10  mg by mouth as needed for migraine. (Patient not taking: Reported on 05/07/2023)     triamcinolone ointment (KENALOG) 0.1 % Apply 1 Application topically 2 (two) times daily as needed (for itching).     Current Facility-Administered Medications  Medication Dose Route Frequency Provider Last Rate Last Admin   0.9 %  sodium chloride  infusion  500 mL Intravenous Once Arya Luttrull, Elspeth SQUIBB, MD       dextrose  5 % solution   Intravenous Continuous Jonnell Hentges, Elspeth SQUIBB, MD        Allergies as of 05/22/2023 - Review Complete 05/22/2023  Allergen Reaction Noted   Adalimumab Rash and Other (See Comments) 09/02/2019   Nsaids Other (See Comments) 11/28/2022    Family History  Problem Relation Age of Onset   Diabetes Mother    Blindness Father    Dementia Father    Diabetes Father    Crohn's disease Son    Colon cancer Neg Hx    Pancreatic cancer Neg Hx    Stomach cancer Neg Hx    Colon polyps Neg Hx    Esophageal cancer Neg Hx    Rectal cancer Neg Hx     Social History   Socioeconomic  History   Marital status: Single    Spouse name: Not on file   Number of children: 4   Years of education: Not on file   Highest education level: Not on file  Occupational History   Not on file  Tobacco Use   Smoking status: Never   Smokeless tobacco: Never  Vaping Use   Vaping status: Never Used  Substance and Sexual Activity   Alcohol use: Yes    Comment: Rare   Drug use: Never   Sexual activity: Not on file  Other Topics Concern   Not on file  Social History Narrative   Not on file   Social Drivers of Health   Financial Resource Strain: Unknown (12/25/2020)   Received from [facility], [facility]   Overall Financial Resource Strain (CARDIA)    Difficulty of Paying Living Expenses: Patient declined  Food Insecurity: No Food Insecurity (03/21/2023)   Hunger Vital Sign    Worried About Running Out of Food in the Last Year: Never true    Ran Out of Food in the Last Year: Never true  Transportation Needs: No Transportation Needs (03/21/2023)   PRAPARE - Administrator, Civil Service (Medical): No    Lack of Transportation (Non-Medical): No  Physical Activity: Insufficiently Active (12/25/2020)   Received from [facility], [facility]   Exercise Vital Sign    Days of Exercise per Week: 2 days    Minutes of Exercise per Session: 30 min  Stress: No Stress Concern Present (12/25/2020)   Received from [facility], [facility] of Occupational Health - Occupational Stress Questionnaire    Feeling of Stress : Only a little  Social Connections: Moderately Integrated (02/25/2022)   Received from [facility], [facility]   Social Network    How would you rate your social network (family, work, friends)?: Adequate participation with social networks  Intimate Partner Violence: Not At Risk (03/21/2023)   Humiliation, Afraid, Rape, and Kick questionnaire    Fear of Current or Ex-Partner: No    Emotionally Abused: No    Physically Abused:  No    Sexually Abused: No    Review of Systems: All other review of systems negative except as mentioned in the HPI.  Physical Exam: Vital signs BP (!) 129/94   Pulse 88   Temp 97.7 F (36.5 C)   Ht 5' 1 (1.549 m)   Wt 154 lb (69.9 kg)   LMP  (LMP Unknown) Comment: IUD, no possibility of pregnancy per patient  SpO2 98%   BMI 29.10 kg/m   General:   Alert,  Well-developed, pleasant and cooperative in NAD Lungs:  Clear throughout to auscultation.   Heart:  Regular rate and rhythm Abdomen:  Soft, nontender and nondistended.   Neuro/Psych:  Alert and cooperative. Normal mood and affect. A and O x 3  Marcey Naval, MD [facility]

## 2023-05-22 NOTE — Progress Notes (Signed)
 Called to room to assist during endoscopic procedure.  Patient ID and intended procedure confirmed with present staff. Received instructions for my participation in the procedure from the performing physician.

## 2023-05-22 NOTE — Patient Instructions (Signed)
 Please read handouts provided. Continue present medications. Await pathology results. Resume previous diet. Continue Skyrizi . Continue cholestyramine , but can reduce dose to avoid constipation. Trial of IB gard as needed for cramping. Elavil  25 mg every night.  YOU HAD AN ENDOSCOPIC PROCEDURE TODAY AT THE [facility]:   Refer to the procedure report that was given to you for any specific questions about what was found during the examination.  If the procedure report does not answer your questions, please call your gastroenterologist to clarify.  If you requested that your care partner not be given the details of your procedure findings, then the procedure report has been included in a sealed envelope for you to review at your convenience later.  YOU SHOULD EXPECT: Some feelings of bloating in the abdomen. Passage of more gas than usual.  Walking can help get rid of the air that was put into your GI tract during the procedure and reduce the bloating. If you had a lower endoscopy (such as a colonoscopy or flexible sigmoidoscopy) you may notice spotting of blood in your stool or on the toilet paper. If you underwent a bowel prep for your procedure, you may not have a normal bowel movement for a few days.  Please Note:  You might notice some irritation and congestion in your nose or some drainage.  This is from the oxygen used during your procedure.  There is no need for concern and it should clear up in a day or so.  SYMPTOMS TO REPORT IMMEDIATELY:  Following lower endoscopy (colonoscopy or flexible sigmoidoscopy):  Excessive amounts of blood in the stool  Significant tenderness or worsening of abdominal pains  Swelling of the abdomen that is new, acute  Fever of 100F or higher.  For urgent or emergent issues, a gastroenterologist can be reached at any hour by calling [phone]. Do not use MyChart messaging for urgent concerns.    DIET:  We do recommend a small meal at  first, but then you may proceed to your regular diet.  Drink plenty of fluids but you should avoid alcoholic beverages for 24 hours.  ACTIVITY:  You should plan to take it easy for the rest of today and you should NOT DRIVE or use heavy machinery until tomorrow (because of the sedation medicines used during the test).    FOLLOW UP: Our staff will call the number listed on your records the next business day following your procedure.  We will call around 7:15- 8:00 am to check on you and address any questions or concerns that you may have regarding the information given to you following your procedure. If we do not reach you, we will leave a message.     If any biopsies were taken you will be contacted by phone or by letter within the next 1-3 weeks.  Please call us  at [phone] if you have not heard about the biopsies in 3 weeks.    SIGNATURES/CONFIDENTIALITY: You and/or your care partner have signed paperwork which will be entered into your electronic medical record.  These signatures attest to the fact that that the information above on your After Visit Summary has been reviewed and is understood.  Full responsibility of the confidentiality of this discharge information lies with you and/or your care-partner.

## 2023-05-23 ENCOUNTER — Telehealth: Payer: Self-pay

## 2023-05-23 NOTE — Telephone Encounter (Signed)
  Follow up Call-     05/22/2023    1:32 PM 11/05/2022    1:10 PM  Call back number  Post procedure Call Back phone  # [phone]  Permission to leave phone message Yes      Patient questions:  Do you have a fever, pain , or abdominal swelling? No. Pain Score  0 *  Have you tolerated food without any problems? Yes.    Have you been able to return to your normal activities? Yes.    Do you have any questions about your discharge instructions: Diet   No. Medications  No. Follow up visit  No.  Do you have questions or concerns about your Care? No.  Actions: * If pain score is 4 or above: No action needed, pain <4.

## 2023-05-24 ENCOUNTER — Telehealth: Payer: Self-pay | Admitting: Gastroenterology

## 2023-05-24 NOTE — Telephone Encounter (Signed)
 Called again, no answer. I will call her back on Monday.

## 2023-05-24 NOTE — Telephone Encounter (Signed)
 Inbound call from patient stating that Dr. General Kenner prescribed Elavil . Patient states it is a antidepressant and is wanting to know why she was put on the medication. Please advise.

## 2023-05-24 NOTE — Telephone Encounter (Signed)
 I called the patient back, no answer, left a message. I think TCA would be good for her symptoms of pain and diarrhea, will call her back to further discuss

## 2023-05-24 NOTE — Telephone Encounter (Signed)
 Returned call to patient. I informed patient that Elavil  was prescribed for her diarrhea and abdominal pain. Pt states that she does not want to take an anti-depressant and is requesting an alternative medication. Please advise, thanks.

## 2023-05-26 ENCOUNTER — Other Ambulatory Visit: Payer: Self-pay | Admitting: Gastroenterology

## 2023-05-27 ENCOUNTER — Other Ambulatory Visit: Payer: Self-pay | Admitting: Gastroenterology

## 2023-05-27 LAB — SURGICAL PATHOLOGY

## 2023-05-27 NOTE — Telephone Encounter (Signed)
 I called the patient and we had a good discussion. I relayed my reasons for considering Elavil  for her.  I think this would be good for both her pain and to reduce her stool frequency.  She did try taking it over the weekend and felt a bit drowsy on the 25 mg dosing.  I would like her to try cutting that tablet in half and take 12 and half milligrams nightly initially to see how she tolerates that.  We did discuss risk is drowsiness but hopefully this is minimal and she may get used to this with more dosing.  I told her this can take 3 to 4 weeks to really kick in and hopefully with more time it helps.  She has just picked up the cholestyramine  and that was helping her before, I advised her to take it once daily and she can increase to twice daily if needed. I also had given her some samples of IBgard and recommend she continue to take that as needed if that works for her pain and cramps.  She will keep me posted and agrees with the plan as outlined.

## 2023-05-27 NOTE — Telephone Encounter (Signed)
 Noted.

## 2023-05-29 ENCOUNTER — Encounter: Payer: Self-pay | Admitting: Gastroenterology

## 2023-05-31 ENCOUNTER — Encounter: Payer: Self-pay | Admitting: Gastroenterology

## 2023-08-14 ENCOUNTER — Other Ambulatory Visit: Payer: Self-pay | Admitting: Gastroenterology

## 2023-10-01 ENCOUNTER — Encounter: Payer: Self-pay | Admitting: Family Medicine

## 2023-10-01 ENCOUNTER — Ambulatory Visit: Admitting: Family Medicine

## 2023-10-01 ENCOUNTER — Telehealth: Payer: Self-pay

## 2023-10-01 ENCOUNTER — Other Ambulatory Visit: Payer: Self-pay | Admitting: Family Medicine

## 2023-10-01 VITALS — BP 107/75 | HR 76 | Temp 98.6°F | Resp 18 | Ht 61.0 in | Wt 155.3 lb

## 2023-10-01 DIAGNOSIS — Z1159 Encounter for screening for other viral diseases: Secondary | ICD-10-CM | POA: Insufficient documentation

## 2023-10-01 DIAGNOSIS — Z7689 Persons encountering health services in other specified circumstances: Secondary | ICD-10-CM | POA: Diagnosis not present

## 2023-10-01 DIAGNOSIS — L659 Nonscarring hair loss, unspecified: Secondary | ICD-10-CM | POA: Insufficient documentation

## 2023-10-01 DIAGNOSIS — E119 Type 2 diabetes mellitus without complications: Secondary | ICD-10-CM

## 2023-10-01 MED ORDER — TIRZEPATIDE 12.5 MG/0.5ML ~~LOC~~ SOAJ: 12.5000 mg | SUBCUTANEOUS | 0 refills | Status: DC

## 2023-10-01 MED ORDER — FLUOCINONIDE 0.05 % EX SOLN: Freq: Two times a day (BID) | CUTANEOUS | 1 refills | Status: DC

## 2023-10-01 NOTE — Assessment & Plan Note (Addendum)
 Taking Mounjaro 10 mg weekly as prescribed. Has been on this dose for 2 months. Tolerating well, wants to increase dose today. Per report A1C is well controlled. Mounjaro 12.5 mg weekly sent to pharmacy.  A1C  Urine microalbumin. Foot exam UP to date with eye exam. CMP with GFR Follow-up in 3 months for A1C and  medication management.

## 2023-10-01 NOTE — Progress Notes (Signed)
 New Patient Office Visit  Subjective    Patient ID: Sarah Meadows, female    DOB: November 11, 1965  Age: 58 y.o. MRN: [id number]  CC:  Chief Complaint  Patient presents with   Establish Care    Patient is here to establish care with a new PCP. Patient would like refills on medication    HPI Sarah Meadows presents to establish care with this practice. She is new to me.   Current medications: Insomnia: Amitriptyline  25 mg as needed at night.  Does not need refills today.   Diabetes: Mounjaro 10 mg weekly. Wants to increase does today.   Hair loss/ scalp: flucinonide 0.05% external solution to scalp daily.  Needs refill today.   Pruritus: Triamcinolone 0.1% ointment. Uses as needed. No refills today.   Crohn's disease: Managed per GI Skyrizi  360 mg  Colestipol 1 gm daily   IUD: Will schedule soon for removal and pap smear.    Chart review: 09/27/23:  Crohn's disease: sees GI with [facility]  On Skyrizi  Started colestipol 1 g daily Cholestyramine  4 g daily      Outpatient Encounter Medications as of 10/01/2023  Medication Sig   augmented betamethasone dipropionate (DIPROLENE-AF) 0.05 % ointment Apply 1 Application topically daily as needed (for irritation- affected areas).   Cholecalciferol (VITAMIN D3) 1000 units CAPS Take 1,000 Units by mouth daily.   cholestyramine  (QUESTRAN ) 4 g packet TAKE 1 PACKET BY MOUTH DAILY.   clobetasol cream (TEMOVATE) 0.05 % Apply 1 application  topically 2 (two) times daily.   hydroquinone 4 % cream Apply 1 Application topically See admin instructions. Apply to affected areas 2 times a day   loperamide  (IMODIUM ) 2 MG capsule Take 2 capsules by mouth, up to three times daily as needed   nystatin cream (MYCOSTATIN) Apply 1 Application topically as needed.   tirzepatide (MOUNJARO) 12.5 MG/0.5ML Pen Inject 12.5 mg into the skin once a week.   triamcinolone ointment (KENALOG) 0.1 % Apply 1 Application topically 2 (two) times daily  as needed (for itching).   [DISCONTINUED] blood glucose meter kit and supplies Dispense based on patient and insurance preference. Use up to four times daily as directed. (FOR ICD-10 E10.9, E11.9).   [DISCONTINUED] fluocinonide (LIDEX) 0.05 % external solution 2 (two) times daily.   [DISCONTINUED] MOUNJARO 10 MG/0.5ML Pen Inject 10 mg into the skin every Wednesday.   amitriptyline  (ELAVIL ) 25 MG tablet Take 1 tablet (25 mg total) by mouth at bedtime.   fluocinonide (LIDEX) 0.05 % external solution Apply topically 2 (two) times daily.   Risankizumab -rzaa (SKYRIZI ) 360 MG/2.4ML SOCT Inject 360 mg into the skin every 8 (eight) weeks.   [DISCONTINUED] acidophilus (RISAQUAD) CAPS capsule Take 1 capsule by mouth daily. (Patient not taking: Reported on 05/07/2023)   [DISCONTINUED] Coenzyme Q10 200 MG capsule Take 200 mg by mouth daily. (Patient not taking: Reported on 05/07/2023)   [DISCONTINUED] cyanocobalamin  1000 MCG tablet Take 1,000 mcg by mouth daily. (Patient not taking: Reported on 05/07/2023)   [DISCONTINUED] dicyclomine  (BENTYL ) 10 MG capsule TAKE 1 CAPSULE (10 MG TOTAL) BY MOUTH 3 (THREE) TIMES DAILY BEFORE MEALS.   [DISCONTINUED] hydroxychloroquine (PLAQUENIL) 200 MG tablet Take 200 mg by mouth daily. (Patient not taking: Reported on 05/07/2023)   [DISCONTINUED] Multiple Vitamin (MULTIVITAMIN WITH MINERALS) TABS tablet Take 1 tablet by mouth daily. (Patient not taking: Reported on 05/07/2023)   [DISCONTINUED] ondansetron  (ZOFRAN -ODT) 4 MG disintegrating tablet Take 1 tablet (4 mg total) by mouth every 6 (six) hours as needed for  nausea or vomiting. (Patient not taking: Reported on 05/07/2023)   [DISCONTINUED] promethazine (PHENERGAN) 25 MG tablet Take 25 mg by mouth every 4 (four) hours as needed for nausea or vomiting. (Patient not taking: Reported on 05/07/2023)   [DISCONTINUED] rizatriptan (MAXALT) 10 MG tablet Take 10 mg by mouth as needed for migraine. (Patient not taking: Reported on 05/07/2023)    Facility-Administered Encounter Medications as of 10/01/2023  Medication   dextrose  5 % solution    Past Medical History:  Diagnosis Date   Allergy 06/19/19   Humiarua   Anemia    Arthritis    Crohn's disease (HCC) 2006   Crohn's disease of both small and large intestine (HCC)    Diabetes (HCC)    Type 2   GERD (gastroesophageal reflux disease) 2021   Headache    Hyperlipidemia 01/2020   Lichen planus    Restless leg syndrome    Sacroiliac inflammation (HCC)     Past Surgical History:  Procedure Laterality Date   CESAREAN SECTION     x 2   CHOLECYSTECTOMY     COLONOSCOPY  04/2018    Family History  Problem Relation Age of Onset   Diabetes Mother    Arthritis Mother    Blindness Father    Dementia Father    Diabetes Father    Vision loss Father    Crohn's disease Son    Obesity Sister    ADD / ADHD Son    Stroke Brother    Colon cancer Neg Hx    Pancreatic cancer Neg Hx    Stomach cancer Neg Hx    Colon polyps Neg Hx    Esophageal cancer Neg Hx    Rectal cancer Neg Hx     Social History   Socioeconomic History   Marital status: Single    Spouse name: Not on file   Number of children: 4   Years of education: Not on file   Highest education level: Some college, no degree  Occupational History   Not on file  Tobacco Use   Smoking status: Never    Passive exposure: Never   Smokeless tobacco: Never  Vaping Use   Vaping status: Never Used  Substance and Sexual Activity   Alcohol use: Not Currently    Comment: Rare   Drug use: Never   Sexual activity: Not Currently    Birth control/protection: I.U.D., Post-menopausal  Other Topics Concern   Not on file  Social History Narrative   Not on file   Social Drivers of Health   Financial Resource Strain: Medium Risk (09/27/2023)   Overall Financial Resource Strain (CARDIA)    Difficulty of Paying Living Expenses: Somewhat hard  Food Insecurity: Food Insecurity Present (09/27/2023)   Hunger Vital Sign     Worried About Running Out of Food in the Last Year: Sometimes true    Ran Out of Food in the Last Year: Sometimes true  Transportation Needs: No Transportation Needs (09/27/2023)   PRAPARE - Administrator, Civil Service (Medical): No    Lack of Transportation (Non-Medical): No  Physical Activity: Insufficiently Active (09/27/2023)   Exercise Vital Sign    Days of Exercise per Week: 1 day    Minutes of Exercise per Session: 30 min  Stress: No Stress Concern Present (09/27/2023)   [employer] of Occupational Health - Occupational Stress Questionnaire    Feeling of Stress: Only a little  Social Connections: Socially Isolated (09/27/2023)   Social Connection and  Isolation Panel    Frequency of Communication with Friends and Family: Once a week    Frequency of Social Gatherings with Friends and Family: Once a week    Attends Religious Services: Never    Database administrator or Organizations: Yes    Attends Banker Meetings: 1 to 4 times per year    Marital Status: Divorced  Intimate Partner Violence: Not At Risk (09/26/2023)   Received from [facility]   HITS    Over the last 12 months how often did your partner physically hurt you?: Never    Over the last 12 months how often did your partner insult you or talk down to you?: Never    Over the last 12 months how often did your partner threaten you with physical harm?: Never    Over the last 12 months how often did your partner scream or curse at you?: Never    ROS      Objective    BP 107/75   Pulse 76   Temp 98.6 F (37 C) (Oral)   Resp 18   Ht 5' 1 (1.549 m)   Wt 155 lb 4.8 oz (70.4 kg)   SpO2 100%   BMI 29.34 kg/m   Physical Exam Vitals and nursing note reviewed.  Constitutional:      General: She is not in acute distress.    Appearance: Normal appearance.   Cardiovascular:     Rate and Rhythm: Regular rhythm.     Heart sounds: Normal heart sounds.  Pulmonary:     Effort:  Pulmonary effort is normal.     Breath sounds: Normal breath sounds.   Skin:    General: Skin is warm and dry.   Neurological:     General: No focal deficit present.     Mental Status: She is alert. Mental status is at baseline.   Psychiatric:        Mood and Affect: Mood normal.        Behavior: Behavior normal.        Thought Content: Thought content normal.        Judgment: Judgment normal.        Assessment & Plan:   Problem List Items Addressed This Visit     Type 2 diabetes mellitus (HCC)   Taking Mounjaro 10 mg weekly as prescribed. Has been on this dose for 2 months. Tolerating well, wants to increase dose today. Per report A1C is well controlled. Mounjaro 12.5 mg weekly sent to pharmacy.  A1C  Urine microalbumin. Foot exam UP to date with eye exam. CMP with GFR Follow-up in 3 months for A1C and  medication management.       Relevant Medications   tirzepatide (MOUNJARO) 12.5 MG/0.5ML Pen   Other Relevant Orders   Comprehensive metabolic panel with GFR   Hemoglobin A1c   Microalbumin / creatinine urine ratio   Lipid panel   Establishing care with new doctor, encounter for - Primary   Screening for viral disease   Relevant Orders   HIV Antibody (routine testing w rflx)   Hair loss disorder   Using flucinonide 0.05% external solution to scalp daily.  Controlling symptoms well. Needs refill today.       Relevant Medications   fluocinonide (LIDEX) 0.05 % external solution  Chronic conditions reviewed. Medication list updated. Crohn's per GI IUD and pap per GYN Agrees with plan of care discussed.  Questions answered.   Return in about 3  months (around 01/01/2024) for DM.   Mickiel Albany, FNP

## 2023-10-01 NOTE — Telephone Encounter (Signed)
 Copied from CRM [id number]. Topic: Clinical - Medication Question >> Oct 01, 2023 10:33 AM Marissa P wrote: Reason for CRM: Patient is requesting to change her meds from 3 months to 1 month tirzepatide (MOUNJARO) 12.5 MG/0.5ML Pen

## 2023-10-01 NOTE — Assessment & Plan Note (Signed)
 Using flucinonide 0.05% external solution to scalp daily.  Controlling symptoms well. Needs refill today.

## 2023-10-02 LAB — COMPREHENSIVE METABOLIC PANEL WITH GFR
ALT: 19 IU/L (ref 0–32)
AST: 20 IU/L (ref 0–40)
Albumin: 4 g/dL (ref 3.8–4.9)
Alkaline Phosphatase: 98 IU/L (ref 44–121)
BUN/Creatinine Ratio: 14 (ref 9–23)
BUN: 10 mg/dL (ref 6–24)
Bilirubin Total: 0.3 mg/dL (ref 0.0–1.2)
CO2: 21 mmol/L (ref 20–29)
Calcium: 9.6 mg/dL (ref 8.7–10.2)
Chloride: 104 mmol/L (ref 96–106)
Creatinine, Ser: 0.73 mg/dL (ref 0.57–1.00)
Globulin, Total: 3.5 g/dL (ref 1.5–4.5)
Glucose: 88 mg/dL (ref 70–99)
Potassium: 4.4 mmol/L (ref 3.5–5.2)
Sodium: 141 mmol/L (ref 134–144)
Total Protein: 7.5 g/dL (ref 6.0–8.5)
eGFR: 96 mL/min/{1.73_m2} (ref >59)

## 2023-10-02 LAB — LIPID PANEL
Chol/HDL Ratio: 4.1 ratio (ref 0.0–4.4)
Cholesterol, Total: 221 mg/dL — ABNORMAL HIGH (ref 100–199)
HDL: 54 mg/dL (ref >39)
LDL Chol Calc (NIH): 142 mg/dL — ABNORMAL HIGH (ref 0–99)
Triglycerides: 141 mg/dL (ref 0–149)
VLDL Cholesterol Cal: 25 mg/dL (ref 5–40)

## 2023-10-02 LAB — HEMOGLOBIN A1C
Est. average glucose Bld gHb Est-mCnc: 105 mg/dL
Hgb A1c MFr Bld: 5.3 % (ref 4.8–5.6)

## 2023-10-02 LAB — MICROALBUMIN / CREATININE URINE RATIO
Creatinine, Urine: 126.4 mg/dL
Microalb/Creat Ratio: 5 mg/g{creat} (ref 0–29)
Microalbumin, Urine: 6.8 ug/mL

## 2023-10-02 LAB — HIV ANTIBODY (ROUTINE TESTING W REFLEX): HIV Screen 4th Generation wRfx: NONREACTIVE

## 2023-10-03 ENCOUNTER — Ambulatory Visit: Payer: Self-pay | Admitting: Family Medicine

## 2023-10-04 ENCOUNTER — Other Ambulatory Visit: Payer: Self-pay | Admitting: Family Medicine

## 2023-10-04 DIAGNOSIS — E782 Mixed hyperlipidemia: Secondary | ICD-10-CM

## 2023-10-04 MED ORDER — EZETIMIBE 10 MG PO TABS: 10.0000 mg | ORAL_TABLET | Freq: Every day | ORAL | 0 refills | Status: AC

## 2023-10-07 ENCOUNTER — Telehealth: Payer: Self-pay

## 2023-10-07 NOTE — Telephone Encounter (Signed)
 Prior auth for: [facility]  Determination: PENDING Auth #: [id number] Valid from: N/A Patient notified via MyChart

## 2023-10-08 ENCOUNTER — Encounter: Payer: Self-pay | Admitting: Family Medicine

## 2023-10-15 NOTE — Telephone Encounter (Signed)
 Prior auth for: MOUNJARO      Determination: APPROVED Auth #: [id number] Valid from: 10/07/23 - 10/06/26 Patient was notified via a MyChart msg by the provider.

## 2023-11-28 ENCOUNTER — Other Ambulatory Visit (HOSPITAL_COMMUNITY): Payer: Self-pay

## 2023-11-28 ENCOUNTER — Telehealth: Payer: Self-pay

## 2023-11-28 NOTE — Telephone Encounter (Signed)
 Pharmacy Patient Advocate Encounter  Per test claim: PA required; PA submitted to above mentioned insurance via Latent Key/confirmation #/EOC [id number] Status is pending

## 2023-11-28 NOTE — Telephone Encounter (Signed)
 Pharmacy Patient Advocate Encounter   Received notification from Patient Insurance that prior authorization for Skyrizi  360MG /2.4ML (150MG /ML) single-dose prefilled cartridge with on-body injector is required/requested.   Insurance verification completed.   The patient is insured through [facility] .   Per test claim: PA required; PA started via CoverMyMeds. KEY [id number] . Waiting for clinical questions to populate.

## 2023-11-29 ENCOUNTER — Telehealth: Payer: Self-pay | Admitting: Gastroenterology

## 2023-11-29 DIAGNOSIS — R197 Diarrhea, unspecified: Secondary | ICD-10-CM

## 2023-11-29 DIAGNOSIS — K50818 Crohn's disease of both small and large intestine with other complication: Secondary | ICD-10-CM

## 2023-11-29 NOTE — Telephone Encounter (Signed)
 Sarah Meadows in reading the note from the other GI group, it appears the patient intended to switch practices to them. Can you clarify with her where she wishes to receive her care, why she saw them if she wishes to continue to see us . If she has indeed switched to another group, they are now responsible for this issue. Thanks

## 2023-11-29 NOTE — Telephone Encounter (Signed)
 Spoke to patient and advised of Dr Hassan recommendation to get stool studies and have her seen in the office for follow up. Patient agrees to come for lab. She has also been scheduled to see Elida Nyle Sharps in follow up on 12/24/23. She verbalizes understanding.  Patient states that she already takes imodium  daily but this is not helpful.

## 2023-11-29 NOTE — Telephone Encounter (Signed)
 Okay. Let's check a fecal calprotectin and C diff PCR to make sure okay. Not sure if she has tried any immodium in the interim, can do so over the weekend if not. She should be booked for a follow up visit with me or APP, first available, otherwise. Thanks

## 2023-11-29 NOTE — Telephone Encounter (Signed)
 Patient states that she thought about switching to the other practice but didn't.  States I cancelled my follow up appointment with them. They didn't run any tests or anything.  Patient states that she has been on Skyrizi  since at least 08/2022 but continues with 5+ diarrheal bowel movements daily. Denies blood in stool. Complains of generalized abdominal pain and states  my stomach hurts when I eat and it hurts when I dont eat. Last office visit with [facility] 04/2023.  Please advise.

## 2023-11-29 NOTE — Telephone Encounter (Signed)
 [facility] call from [facility] stating patient advised them that Skyrizi  has not been working for her and she has been having severe stomach pains. Patient is requesting to know if she should continue with medication. [facility] is also requesting a call at [phone] to be advised if medication should be shipped out. Please advise, thank you

## 2023-11-29 NOTE — Telephone Encounter (Signed)
 Dr Leigh-  Please advise...  It appears that patient has switched practices to [facility] (as of 09/27/23)/

## 2023-12-02 NOTE — Telephone Encounter (Signed)
 Pharmacy Patient Advocate Encounter  Received notification from [facility] that Prior Authorization for Skyrizi  360MG /2.4ML (150MG /ML) single-dose prefilled cartridge with on-body injector has been APPROVED from 11-29-2023 to 11-28-2024   PA #/Case ID/Reference #: [id number]   This approval covers 360mg  + 180mg  + 150mg 

## 2023-12-04 NOTE — Telephone Encounter (Signed)
 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 She inquires as to whether they may continue to dispense Skyrizi  for patient following her complaints of continued abdominal pain. I advised that they may continue Skyrizi  dispense and we will be seeing her in the office for follow up 12/24/23 with additional evaluation/recommendations at that time. She verbalizes understanding.

## 2023-12-19 ENCOUNTER — Other Ambulatory Visit

## 2023-12-19 ENCOUNTER — Encounter: Payer: Self-pay | Admitting: Family Medicine

## 2023-12-19 ENCOUNTER — Telehealth (INDEPENDENT_AMBULATORY_CARE_PROVIDER_SITE_OTHER): Admitting: Family Medicine

## 2023-12-19 VITALS — Ht 61.0 in | Wt 158.0 lb

## 2023-12-19 DIAGNOSIS — L439 Lichen planus, unspecified: Secondary | ICD-10-CM | POA: Diagnosis not present

## 2023-12-19 DIAGNOSIS — E119 Type 2 diabetes mellitus without complications: Secondary | ICD-10-CM

## 2023-12-19 MED ORDER — CLOBETASOL PROPIONATE 0.05 % EX LOTN: 1.0000 | TOPICAL_LOTION | Freq: Two times a day (BID) | CUTANEOUS | 2 refills | Status: DC

## 2023-12-19 MED ORDER — TIRZEPATIDE 5 MG/0.5ML ~~LOC~~ SOAJ: 5.0000 mg | SUBCUTANEOUS | 0 refills | Status: DC

## 2023-12-19 NOTE — Progress Notes (Signed)
 Established Patient Office Visit  Subjective   Patient ID: Sarah Meadows, female    DOB: 1966-02-23  Age: 58 y.o. MRN: [id number]  Chief Complaint  Patient presents with   Medication Problem    Change to a lower dose of Monjuro   I connected with  Redith Drach on 12/19/23 by a video enabled telemedicine application and verified that I am speaking with the correct person using two identifiers.   I discussed the limitations of evaluation and management by telemedicine. The patient expressed understanding and agreed to proceed. Virtual Visit via Video Note  I connected with Tamsin Nader on 12/19/23 at  8:30 AM EDT by a video enabled telemedicine application and verified that I am speaking with the correct person using two identifiers.  Location: Patient: car Provider: office   I discussed the limitations of evaluation and management by telemedicine and the availability of in person appointments. The patient expressed understanding and agreed to proceed.  History of Present Illness:    Observations/Objective:   Assessment and Plan:   Follow Up Instructions:    I discussed the assessment and treatment plan with the patient. The patient was provided an opportunity to ask questions and all were answered. The patient agreed with the plan and demonstrated an understanding of the instructions.   The patient was advised to call back or seek an in-person evaluation if the symptoms worsen or if the condition fails to improve as anticipated.     Darice JONELLE Brownie, FNP   HPI  Diabetes: Medication compliance: Taking Mounjaro  12.5 mg weekly.  Stopped taking one month ago due to having some left side pain. Pain resolved once this dose was stopped.  Denies hypoglycemia.  Pertinent lab work: A1C: 10/01/23: 5.3    Continue current medication regimen: Start Mounjaro  5 mg weekly   Follow-up: 4 weeks for medication management.    Lichen planus: Chronic condition. Present on  buttocks. Lidex  solution is not effective. Wants to see if there is another medication to help.    ROS    Objective:     Ht 5' 1 (1.549 m)   Wt 158 lb (71.7 kg)   BMI 29.85 kg/m  BP Readings from Last 3 Encounters:  10/01/23 107/75  05/22/23 132/82  05/07/23 120/72      Physical Exam Nursing note reviewed.  Constitutional:      Appearance: Normal appearance.  Pulmonary:     Effort: Pulmonary effort is normal.  Skin:    General: Skin is warm and dry.  Neurological:     General: No focal deficit present.     Mental Status: She is alert. Mental status is at baseline.  Psychiatric:        Mood and Affect: Mood normal.        Behavior: Behavior normal.        Thought Content: Thought content normal.        Judgment: Judgment normal.      No results found for any visits on 12/19/23.  Last hemoglobin A1c Lab Results  Component Value Date   HGBA1C 5.3 10/01/2023      The 10-year ASCVD risk score (Arnett DK, et al., 2019) is: 10.6%    Assessment & Plan:   Problem List Items Addressed This Visit     Type 2 diabetes mellitus (HCC) - Primary   Stopped Mounjaro  12.5 mg due to side effects. Abdominal pain resolved. Wants to restart at lower dose. Mounjaro  5 mg weekly. Last A1C 10/01/23:  5.3 Diabetes is well controlled. Follow-up in 4 weeks, okay for My Chart      Relevant Medications   tirzepatide  (MOUNJARO ) 5 MG/0.5ML Pen   Lichen planus   Chronic condition. Current treatment with Lidex  not effective. Will try clobetasol  0.05% lotion topically twice per day. May benefit from dermatology referral for evaluation. Follow-up as needed.       Relevant Medications   Clobetasol  Propionate 0.05 % lotion  Agrees with plan of care discussed.  Questions answered.   Return in about 4 weeks (around 01/16/2024) for DM med manaagement .    Darice JONELLE Brownie, FNP

## 2023-12-19 NOTE — Assessment & Plan Note (Signed)
 Stopped Mounjaro  12.5 mg due to side effects. Abdominal pain resolved. Wants to restart at lower dose. Mounjaro  5 mg weekly. Last A1C 10/01/23: 5.3 Diabetes is well controlled. Follow-up in 4 weeks, okay for My Chart

## 2023-12-19 NOTE — Assessment & Plan Note (Signed)
 Chronic condition. Current treatment with Lidex  not effective. Will try clobetasol  0.05% lotion topically twice per day. May benefit from dermatology referral for evaluation. Follow-up as needed.

## 2023-12-20 ENCOUNTER — Other Ambulatory Visit: Payer: Self-pay | Admitting: Family Medicine

## 2023-12-20 DIAGNOSIS — L659 Nonscarring hair loss, unspecified: Secondary | ICD-10-CM

## 2023-12-23 ENCOUNTER — Other Ambulatory Visit

## 2023-12-23 DIAGNOSIS — R197 Diarrhea, unspecified: Secondary | ICD-10-CM

## 2023-12-23 DIAGNOSIS — K50818 Crohn's disease of both small and large intestine with other complication: Secondary | ICD-10-CM

## 2023-12-24 ENCOUNTER — Encounter: Payer: Self-pay | Admitting: Nurse Practitioner

## 2023-12-24 ENCOUNTER — Other Ambulatory Visit (INDEPENDENT_AMBULATORY_CARE_PROVIDER_SITE_OTHER)

## 2023-12-24 ENCOUNTER — Ambulatory Visit: Admitting: Nurse Practitioner

## 2023-12-24 VITALS — BP 98/70 | HR 91 | Ht 61.0 in | Wt 157.1 lb

## 2023-12-24 DIAGNOSIS — R10815 Periumbilic abdominal tenderness: Secondary | ICD-10-CM | POA: Diagnosis not present

## 2023-12-24 DIAGNOSIS — K5080 Crohn's disease of both small and large intestine without complications: Secondary | ICD-10-CM | POA: Diagnosis not present

## 2023-12-24 DIAGNOSIS — I493 Ventricular premature depolarization: Secondary | ICD-10-CM

## 2023-12-24 DIAGNOSIS — K50919 Crohn's disease, unspecified, with unspecified complications: Secondary | ICD-10-CM

## 2023-12-24 DIAGNOSIS — R1084 Generalized abdominal pain: Secondary | ICD-10-CM

## 2023-12-24 DIAGNOSIS — I499 Cardiac arrhythmia, unspecified: Secondary | ICD-10-CM | POA: Diagnosis not present

## 2023-12-24 LAB — CBC WITH DIFFERENTIAL/PLATELET
Basophils Absolute: 0.1 K/uL (ref 0.0–0.1)
Basophils Relative: 1 % (ref 0.0–3.0)
Eosinophils Absolute: 0.2 K/uL (ref 0.0–0.7)
Eosinophils Relative: 3.4 % (ref 0.0–5.0)
HCT: 38.6 % (ref 36.0–46.0)
Hemoglobin: 12.9 g/dL (ref 12.0–15.0)
Lymphocytes Relative: 43.6 % (ref 12.0–46.0)
Lymphs Abs: 2.6 K/uL (ref 0.7–4.0)
MCHC: 33.4 g/dL (ref 30.0–36.0)
MCV: 86.8 fl (ref 78.0–100.0)
Monocytes Absolute: 0.4 K/uL (ref 0.1–1.0)
Monocytes Relative: 6.6 % (ref 3.0–12.0)
Neutro Abs: 2.7 K/uL (ref 1.4–7.7)
Neutrophils Relative %: 45.4 % (ref 43.0–77.0)
Platelets: 300 K/uL (ref 150.0–400.0)
RBC: 4.44 Mil/uL (ref 3.87–5.11)
RDW: 15.2 % (ref 11.5–15.5)
WBC: 5.9 K/uL (ref 4.0–10.5)

## 2023-12-24 LAB — COMPREHENSIVE METABOLIC PANEL WITH GFR
ALT: 18 U/L (ref 0–35)
AST: 19 U/L (ref 0–37)
Albumin: 4.1 g/dL (ref 3.5–5.2)
Alkaline Phosphatase: 74 U/L (ref 39–117)
BUN: 12 mg/dL (ref 6–23)
CO2: 30 meq/L (ref 19–32)
Calcium: 9.9 mg/dL (ref 8.4–10.5)
Chloride: 101 meq/L (ref 96–112)
Creatinine, Ser: 0.69 mg/dL (ref 0.40–1.20)
GFR: 96.47 mL/min (ref >60.00)
Glucose, Bld: 77 mg/dL (ref 70–99)
Potassium: 3.9 meq/L (ref 3.5–5.1)
Sodium: 138 meq/L (ref 135–145)
Total Bilirubin: 0.5 mg/dL (ref 0.2–1.2)
Total Protein: 8.4 g/dL — ABNORMAL HIGH (ref 6.0–8.3)

## 2023-12-24 LAB — MAGNESIUM: Magnesium: 1.6 mg/dL (ref 1.5–2.5)

## 2023-12-24 LAB — SEDIMENTATION RATE: Sed Rate: 50 mm/h — ABNORMAL HIGH (ref 0–30)

## 2023-12-24 LAB — C-REACTIVE PROTEIN: CRP: <1 mg/dL (ref 0.5–20.0)

## 2023-12-24 LAB — TSH: TSH: 1.17 u[IU]/mL (ref 0.35–5.50)

## 2023-12-24 NOTE — Progress Notes (Signed)
 Agree with assessment and plan as outlined.  Sorry to hear symptoms are persisting.  Sarah Meadows keep me posted on results of her stool test, calprotectin.  If her symptoms are this bad and persist we may need to do another colonoscopy to ensure her IBD is controlled.  Otherwise if it is seemingly controlled based on her workup then we will need to be more aggressive about management of functional change, consider TCA etc.

## 2023-12-24 NOTE — Patient Instructions (Signed)
 It is recommended that you go to an Urgent Care today for an EKG due to irregular heart rhythm.  Go to the emergency room if you develop chest pain, dizziness, and shortness of breath or severe abdominal pain.  Increase your fluid intake   Eat a bland diet.  Thank you for trusting me with your gastrointestinal care!   Elida Shawl, CRNP   _______________________________________________________  If your blood pressure at your visit was 140/90 or greater, please contact your primary care physician to follow up on this.  _______________________________________________________  If you are age 69 or older, your body mass index should be between 23-30. Your Body mass index is 29.69 kg/m. If this is out of the aforementioned range listed, please consider follow up with your Primary Care Provider.  If you are age 7 or younger, your body mass index should be between 19-25. Your Body mass index is 29.69 kg/m. If this is out of the aformentioned range listed, please consider follow up with your Primary Care Provider.   ________________________________________________________  The [facility] providers would like to encourage you to use MYCHART to communicate with providers for non-urgent requests or questions.  Due to long hold times on the telephone, sending your provider a message by [facility] may be a faster and more efficient way to get a response.  Please allow 48 business hours for a response.  Please remember that this is for non-urgent requests.  _______________________________________________________  [facility] is using a team-based approach to care.  Your team is made up of your doctor and two to three APPS. Our APPS (Nurse Practitioners and Physician Assistants) work with your physician to ensure care continuity for you. They are fully qualified to address your health concerns and develop a treatment plan. They communicate directly with your gastroenterologist to  care for you. Seeing the Advanced Practice Practitioners on your physician's team can help you by facilitating care more promptly, often allowing for earlier appointments, access to diagnostic testing, procedures, and other specialty referrals.

## 2023-12-24 NOTE — Progress Notes (Addendum)
 12/24/2023 Sarah Meadows [id number] [birth year]   Chief Complaint: Crohn's disease, diarrhea and abdominal pain   History of Present Illness: Sarah Meadows is a 58 year old female with a past medical history of ileocolonic Crohn's disease initially diagnosed in 2006.  Previously on Remicade which was discontinued in 2021 when infliximab levels were undetectable with high antibody level.  She was subsequently transition to Humira which was discontinued after she developed a rash and was initially started on Stelara  in 2021 which she took for 4 to 6 months then self discontinued then Skyrizi  was restarted 12/2021.  Colonoscopy 10/2022 showed no active inflammation with concerns for fibrotic ileal stricturing disease and she subsequently underwent robotic ileocolectomy 03/2023.  She was last seen in office by [facility] Tenaya May, NP 05/07/2023, refer to that office visit for extensive comprehensive history review. At that time, the patient was having abdominal pain and diarrhea. Stool culture was negative. Fecal calprotectin level was 115 ( borderline elevated value). She was prescribed Dicyclomine  10 mg 3 times daily, Cholestyramine  twice daily and Imodium  2 tabs 1-3 times daily as needed. She continues to have generalized abdominal pain and frequent diarrhea without improvement. She contacted our office 11/29/2023 with persistent abdominal pain and diarrhea and a fecal calprotectin and C. difficile PCR test were ordered, however, she only received the container for the fecal calprotectin level which she submitted to the lab earlier today.  She awakens in the morning with generalized abdominal pain which is worse within 5 minutes after eating any food. At this point, she does not eat breakfast or lunch and eats dinner after she returns from work after 5:30 PM which results in passing 4-5 nonbloody diarrhea stools. Drinking water results and abdominal pain. Bowel movements with increased urgency, sometimes soils self  a bit before arriving to the bathroom. She feels her abdominal pain and diarrhea is worse since she underwent ileocolectomy 03/2023.  No nausea or vomiting. Her last Skyrizi  injection was 8/28, next injection due 10/23. She stopped Mounjaro  1-1/2 months ago without improvement regarding her GI symptoms. She cannot recall if she tried Elavil  for abdominal pain as previously prescribed by Dr. Leigh.      Latest Ref Rng & Units 05/01/2023    3:04 PM 03/21/2023    5:11 AM 03/08/2023    2:01 PM  CBC  WBC 4.0 - 10.5 K/uL 5.6  10.2  5.3   Hemoglobin 12.0 - 15.0 g/dL 87.4  88.7  86.3   Hematocrit 36.0 - 46.0 % 38.0  33.7  39.4   Platelets 150.0 - 400.0 K/uL 332.0  227  253        Latest Ref Rng & Units 10/01/2023    8:55 AM 05/01/2023    3:04 PM 03/21/2023    5:11 AM  CMP  Glucose 70 - 99 mg/dL 88  96  893   BUN 6 - 24 mg/dL 10  12  8    Creatinine 0.57 - 1.00 mg/dL 9.26  9.27  9.27   Sodium 134 - 144 mmol/L 141  141  137   Potassium 3.5 - 5.2 mmol/L 4.4  3.4  4.1   Chloride 96 - 106 mmol/L 104  106  107   CO2 20 - 29 mmol/L 21  28  24    Calcium  8.7 - 10.2 mg/dL 9.6  9.3  8.9   Total Protein 6.0 - 8.5 g/dL 7.5  8.1    Total Bilirubin 0.0 - 1.2 mg/dL 0.3  0.4    Alkaline  Phos 44 - 121 IU/L 98  80    AST 0 - 40 IU/L 20  21    ALT 0 - 32 IU/L 19  25      CTAP with contrast 04/29/2023:  FINDINGS: Lower chest: Mild linear opacity lung bases likely scar or atelectasis. No pleural effusion.   Hepatobiliary: Fatty liver infiltration. Previous cholecystectomy. Patent portal vein.   Pancreas: Unremarkable. No pancreatic ductal dilatation or surrounding inflammatory changes.   Spleen: Spleen is nonenlarged but there is mildly septated cystic lesion medially in the spleen measuring 2.5 cm, unchanged from prior MRI and felt to be a benign cystic lesion. Tiny focus seen more centrally on series 2, image 19 as well is also similar. Small hilar splenule. Small splenic granuloma.    Adrenals/Urinary Tract: Adrenal glands are preserved. No enhancing renal mass. There are some tiny subcentimeter low-attenuation lesions along each kidney which are too small to completely characterize but has a benign features and likely Bosniak 2 renal cystic foci. These are also simple appearing on prior MRI. No specific imaging follow-up. Preserved contours of the contracted urinary bladder. The ureters have normal course and caliber extending down to the urinary bladder.   Stomach/Bowel: Oral contrast was administered. The stomach has a normal course and caliber with some moderate debris. Surgical changes of resection of the distal small bowel, proximal colon with a primary anastomosis. Loops in this location are without wall thickening or adjacent inflammatory stranding the small bowel overall has a relatively normal course and caliber when adjusting for the technique.   The colon overall is nondilated. There are a few scattered colonic diverticula diffusely but more extensive towards the descending and sigmoid colon. The colon along the distal descending colon and along the sigmoid region does have wall thickening with some vascular engorgement. Some thickening as well towards the rectum. Please correlate for an area of colitis. With history this could be inflammatory. No obstruction, dilatation.   Vascular/Lymphatic: Normal caliber aorta and IVC. Mild vascular calcifications. Few prominent not pathologically enlarged retroperitoneal nodes are seen.   Reproductive: IUD in the uterus.   Other: No free intra-abdominal air or free fluid.   Musculoskeletal: Slight thickening of the anterior abdominal wall/pelvic wall consistent with surgical history. Degenerative changes seen of the spine and pelvis.   IMPRESSION: Interval surgical changes with resection and primary anastomosis of distal ileum and proximal colon. No obstruction, free air or free fluid.   There is wall  thickening along the distal colon including the distal descending colon, sigmoid and rectum with vascular engorgement. Please correlate for an active process. With the patient's history an inflammatory process is possible.   Colonic diverticula.  MOST RECENT GI PROCEDURES:  Colonoscopy 05/22/2023: - The examined portion of the ileum was normal.  - Patent end-to-end ileo-colonic anastomosis, characterized by healthy appearing mucosa. Rutgeert's score i1. - Diverticulosis in the transverse colon and in the left colon.  - Internal hemorrhoids.  - The examination was otherwise normal.  - Biopsies were taken with a cold forceps from the right colon, left colon and transverse colon for evaluation of microscopic colitis. Overall, NO active inflammation. Suspect post operative functional change causing her symptoms. 1. Surgical [P], colon nos, random sites :       COLONIC MUCOSA WITH NO SIGNIFICANT PATHOLOGIC CHANGES.       NO MICROSCOPIC COLITIS, ACTIVE INFLAMMATION OR GRANULOMAS.   Current Outpatient Medications on File Prior to Visit  Medication Sig Dispense Refill   amitriptyline  (ELAVIL )  25 MG tablet Take 1 tablet (25 mg total) by mouth at bedtime. 90 tablet 1   augmented betamethasone dipropionate (DIPROLENE-AF) 0.05 % ointment Apply 1 Application topically daily as needed (for irritation- affected areas).     Cholecalciferol (VITAMIN D3) 1000 units CAPS Take 1,000 Units by mouth daily.     ezetimibe  (ZETIA ) 10 MG tablet Take 1 tablet (10 mg total) by mouth daily. 90 tablet 0   fluocinonide  (LIDEX ) 0.05 % external solution APPLY TOPICALLY TWICE A DAY 60 mL 1   hydroquinone 4 % cream Apply 1 Application topically See admin instructions. Apply to affected areas 2 times a day     loperamide  (IMODIUM ) 2 MG capsule Take 2 capsules by mouth, up to three times daily as needed     nystatin cream (MYCOSTATIN) Apply 1 Application topically as needed.     Risankizumab -rzaa (SKYRIZI ) 360 MG/2.4ML SOCT  Inject 360 mg into the skin every 8 (eight) weeks. 2.4 mL 6   triamcinolone ointment (KENALOG) 0.1 % Apply 1 Application topically 2 (two) times daily as needed (for itching).     tirzepatide  (MOUNJARO ) 5 MG/0.5ML Pen Inject 5 mg into the skin once a week. (Patient not taking: Reported on 12/24/2023) 2 mL 0   Current Facility-Administered Medications on File Prior to Visit  Medication Dose Route Frequency Provider Last Rate Last Admin   dextrose  5 % solution   Intravenous Continuous Armbruster, Elspeth SQUIBB, MD       Allergies  Allergen Reactions   Adalimumab Rash and Other (See Comments)    Humira   Nsaids Other (See Comments)    Crohn's = NO NSAIDs   On Skyriziurrent Medications, Allergies, Past Medical History, Past Surgical History, Family History and Social History were reviewed in [employer] record.  Review of Systems:   Constitutional: Negative for fever, sweats, chills or weight loss.  Respiratory: Negative for shortness of breath.   Cardiovascular: Negative for chest pain, palpitations and leg swelling.  Gastrointestinal: See HPI.  Musculoskeletal: Negative for back pain or muscle aches.  Neurological: Negative for dizziness, headaches or paresthesias.    Physical Exam: BP 98/70   Pulse 91   Ht 5' 1 (1.549 m)   Wt 157 lb 2 oz (71.3 kg)   BMI 29.69 kg/m   Wt Readings from Last 3 Encounters:  12/24/23 157 lb 2 oz (71.3 kg)  12/19/23 158 lb (71.7 kg)  10/01/23 155 lb 4.8 oz (70.4 kg)    General: 58 year old female in no acute distress. Head: Normocephalic and atraumatic. Eyes: No scleral icterus. Conjunctiva pink . Ears: Normal auditory acuity. Mouth: Dentition intact. No ulcers or lesions.  Lungs: Clear throughout to auscultation. Heart: Irregular rhythm, rate controlled. No murmur. Abdomen: Soft, nondistended. Abdomen is not firm.  Mild periumbilical tenderness without rebound or guarding.  No masses or hepatomegaly. Normal bowel sounds x 4  quadrants.  Rectal: Deferred.  Musculoskeletal: Symmetrical with no gross deformities. Extremities: No edema. Neurological: Alert oriented x 4. No focal deficits.  Psychological: Alert and cooperative. 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 On Skyrizi .  Colonoscopy 05/2023 without evidence of active colitis. Previously took Dicyclomine  and IBgard without improvement. Currently taking Imodium  4 tabs daily without improvement. Cholestyramine  resulted in constipation, no BM x 3 days.  Abdominal exam today  showed a soft nondistended abdomen with very mild periumbilical tenderness without rebound or guarding. - Await fecal calprotectin result - C. difficile PCR - CBC, CMP, CRP and sed rate - Push fluids, bland diet - Tylenol  500 mg p.o. twice daily as needed - Consider CTAP if her abdominal pain worsens  Irregular heart rhythm on exam today, hemodynamically stable.  Patient endorses feeling intermittent palpitations. No CP, dizziness or SOB.  - EKG recommended at this time as patient's heart rhythm is actively irregular, rate controlled.  Patient will attempt to contact PCP now to see if their office can do an EKG at this time, if not,  patient was instructed to go to urgent care for EKG today. - Patient was instructed to go to the emergency room if she develops chest pain, shortness of breath or dizziness - Labs as ordered above to also electrolytes, magnesium and TSH  ADDENDUM: Patient was seen by [facility] PCP Lorane Bob PA-C 12/24/2023 as requested, EKG showed EKG shows sinus rhythm with frequent premature ventricular complexes in a pattern of bigeminy. Rate is 86. PR interval 148, QRS 90, QTc 466. Cardiology referral was provided for further  evaluation, likely including a Holter monitor and ECHO. Magnesium level 1.6 and TSH 1.17 per our lab, repeat magnesium level 1.5 per PCP who ordered magnesium oral supplement.   ADDENDUM: Fecal calprotectin level elevated at 262. C. Diff PCR not yet collected.

## 2023-12-25 ENCOUNTER — Ambulatory Visit: Payer: Self-pay | Admitting: Nurse Practitioner

## 2023-12-25 ENCOUNTER — Encounter: Payer: Self-pay | Admitting: Family Medicine

## 2023-12-26 NOTE — Progress Notes (Signed)
 Magnesium level low. This could be due to chronic diarrhea or other causes. Recommend taking oral magnesium supplement 240-1000mg  a day, usually divided into doses twice daily. Magnesium chloride or magnesium lactate forms may cause less diarrhea than magnesium oxide. Follow-up with your PCP for further monitoring and treatment. Go to the ER if develop any new concerning symptoms such as abnormal heart rhythm or seizures.  Follow recommendations provided during visit. Follow-up with PCP as directed.

## 2023-12-27 LAB — CALPROTECTIN: Calprotectin: 262 ug/g — ABNORMAL HIGH

## 2023-12-30 ENCOUNTER — Ambulatory Visit: Payer: Self-pay | Admitting: Gastroenterology

## 2023-12-30 ENCOUNTER — Telehealth: Payer: Self-pay

## 2023-12-30 DIAGNOSIS — K50919 Crohn's disease, unspecified, with unspecified complications: Secondary | ICD-10-CM

## 2023-12-30 DIAGNOSIS — Z79899 Other long term (current) drug therapy: Secondary | ICD-10-CM

## 2023-12-30 DIAGNOSIS — R1084 Generalized abdominal pain: Secondary | ICD-10-CM

## 2023-12-30 DIAGNOSIS — R195 Other fecal abnormalities: Secondary | ICD-10-CM

## 2023-12-30 NOTE — Telephone Encounter (Signed)
 Thanks Jan, patient should still complete C.diff test. THX

## 2023-12-30 NOTE — Telephone Encounter (Signed)
 Called patient.  She said she just received a call from [place], [place] that her fecal calprotectin was elevated and she has been scheduled for a MR Enterography on 9-22 for Dr. Leigh. She has the C Diff test at home and said she can do that if it is still needed.

## 2023-12-30 NOTE — Telephone Encounter (Signed)
-----   Message from Elida CHRISTELLA Shawl sent at 12/30/2023  5:32 AM EDT ----- Dr. Leigh, see additional addendum notes. Jan, pls contact patient and remind her to complete the C. Diff stool test that I reordered at the time of this office visit. THX.

## 2023-12-31 ENCOUNTER — Other Ambulatory Visit

## 2023-12-31 DIAGNOSIS — K50818 Crohn's disease of both small and large intestine with other complication: Secondary | ICD-10-CM

## 2023-12-31 DIAGNOSIS — R197 Diarrhea, unspecified: Secondary | ICD-10-CM

## 2024-01-01 ENCOUNTER — Ambulatory Visit (INDEPENDENT_AMBULATORY_CARE_PROVIDER_SITE_OTHER): Admitting: Family Medicine

## 2024-01-01 ENCOUNTER — Encounter: Payer: Self-pay | Admitting: Family Medicine

## 2024-01-01 VITALS — BP 103/81 | HR 81 | Temp 98.6°F | Ht 61.0 in | Wt 154.0 lb

## 2024-01-01 DIAGNOSIS — H6992 Unspecified Eustachian tube disorder, left ear: Secondary | ICD-10-CM | POA: Insufficient documentation

## 2024-01-01 DIAGNOSIS — E119 Type 2 diabetes mellitus without complications: Secondary | ICD-10-CM

## 2024-01-01 LAB — CLOSTRIDIUM DIFFICILE TOXIN B, QUALITATIVE, REAL-TIME PCR: Toxigenic C. Difficile by PCR: NOT DETECTED

## 2024-01-01 MED ORDER — TIRZEPATIDE 7.5 MG/0.5ML ~~LOC~~ SOAJ: 7.5000 mg | SUBCUTANEOUS | 0 refills | Status: DC

## 2024-01-01 MED ORDER — CETIRIZINE HCL 10 MG PO TABS: 10.0000 mg | ORAL_TABLET | Freq: Every day | ORAL | 11 refills | Status: AC

## 2024-01-01 MED ORDER — FLUTICASONE PROPIONATE 50 MCG/ACT NA SUSP: 2.0000 | Freq: Every day | NASAL | 0 refills | Status: AC

## 2024-01-01 NOTE — Progress Notes (Signed)
 Established Patient Office Visit  Subjective   Patient ID: Sarah Meadows, female    DOB: 12/01/1965  Age: 58 y.o. MRN: [id number]  Chief Complaint  Patient presents with   Diabetes    HPI  Diabetes: Medication compliance: Taking Mounjaro  5 mg weekly. Tolerating this dose well.  Denies chest pain, shortness of breath, vision changes, polydipsia, polyphagia, polyuria. Denies hypoglycemia.  Pertinent lab work: A1C: 10/01/23: A1C 5.3 Monitoring: blood sugar readings at home: does not check at home.           Continue current medication regimen: Mounjaro  7.5 mg weekly to start in 2 weeks.  Well controlled: check A1C today   Follow-up: 3 months   Hearing decreased. No ear pain. No impacted cerumen. Saline rinses. Cetirizine  10 mg daily Flonase  nasal spray.     ROS    Objective:     BP 103/81   Pulse 81   Temp 98.6 F (37 C) (Oral)   Ht 5' 1 (1.549 m)   Wt 154 lb (69.9 kg)   LMP  (LMP Unknown) Comment: IUD, no possibility of pregnancy per patient  SpO2 93%   BMI 29.10 kg/m    Physical Exam Vitals and nursing note reviewed.  Constitutional:      General: She is not in acute distress.    Appearance: Normal appearance.  HENT:     Right Ear: Tympanic membrane normal. There is no impacted cerumen.     Left Ear: Tympanic membrane normal. There is no impacted cerumen.  Cardiovascular:     Rate and Rhythm: Normal rate and regular rhythm.     Heart sounds: Normal heart sounds.  Pulmonary:     Effort: Pulmonary effort is normal.     Breath sounds: Normal breath sounds.  Skin:    General: Skin is warm and dry.  Neurological:     General: No focal deficit present.     Mental Status: She is alert. Mental status is at baseline.  Psychiatric:        Mood and Affect: Mood normal.        Behavior: Behavior normal.        Thought Content: Thought content normal.        Judgment: Judgment normal.      No results found for any visits on 01/01/24.    The  10-year ASCVD risk score (Arnett DK, et al., 2019) is: 5.2%    Assessment & Plan:   Problem List Items Addressed This Visit     Type 2 diabetes mellitus (HCC) - Primary   Taking Mounjaro  5 mg weekly. Tolerating this dose well.  Denies chest pain, shortness of breath, vision changes, polydipsia, polyphagia, polyuria. Denies hypoglycemia.  Pertinent lab work: A1C: 10/01/23: A1C 5.3 Increase Mounjaro  to 7.5 mg weekly. A1C today. Follow-up in 2 months on My Chart for medication management.         Relevant Medications   tirzepatide  (MOUNJARO ) 7.5 MG/0.5ML Pen   Other Relevant Orders   Hemoglobin A1c   Dysfunction of left eustachian tube   Reports hearing is decreased in left. No ear pain. Not impacted with cerumen. TMs normal.  Present for 2 days. Recommend saline nasal rinses. Cetirizine  10 mg daily. Flonase  nasal spray BID. Return if symptoms do not resolve.       Relevant Medications   cetirizine  (ZYRTEC ) 10 MG tablet   fluticasone  (FLONASE ) 50 MCG/ACT nasal spray  Agrees with plan of care discussed.  Questions answered.   Return  in about 2 months (around 03/02/2024) for DM.    Darice JONELLE Brownie, FNP

## 2024-01-01 NOTE — Assessment & Plan Note (Signed)
 Reports hearing is decreased in left. No ear pain. Not impacted with cerumen. TMs normal.  Present for 2 days. Recommend saline nasal rinses. Cetirizine  10 mg daily. Flonase  nasal spray BID. Return if symptoms do not resolve.

## 2024-01-01 NOTE — Assessment & Plan Note (Signed)
 Taking Mounjaro  5 mg weekly. Tolerating this dose well.  Denies chest pain, shortness of breath, vision changes, polydipsia, polyphagia, polyuria. Denies hypoglycemia.  Pertinent lab work: A1C: 10/01/23: A1C 5.3 Increase Mounjaro  to 7.5 mg weekly. A1C today. Follow-up in 2 months on My Chart for medication management.

## 2024-01-02 ENCOUNTER — Ambulatory Visit: Payer: Self-pay | Admitting: Family Medicine

## 2024-01-02 LAB — HEMOGLOBIN A1C
Est. average glucose Bld gHb Est-mCnc: 111 mg/dL
Hgb A1c MFr Bld: 5.5 % (ref 4.8–5.6)

## 2024-01-06 ENCOUNTER — Ambulatory Visit (HOSPITAL_COMMUNITY)
Admission: RE | Admit: 2024-01-06 | Discharge: 2024-01-06 | Disposition: A | Discharge status: Home / Self Care | Source: Ambulatory Visit | Attending: Gastroenterology

## 2024-01-06 ENCOUNTER — Ambulatory Visit (HOSPITAL_COMMUNITY)
Admission: RE | Admit: 2024-01-06 | Discharge: 2024-01-06 | Disposition: A | Discharge status: Home / Self Care | Source: Ambulatory Visit | Attending: Gastroenterology | Admitting: Gastroenterology

## 2024-01-06 DIAGNOSIS — K50919 Crohn's disease, unspecified, with unspecified complications: Secondary | ICD-10-CM | POA: Insufficient documentation

## 2024-01-06 DIAGNOSIS — Z79899 Other long term (current) drug therapy: Secondary | ICD-10-CM

## 2024-01-06 DIAGNOSIS — R195 Other fecal abnormalities: Secondary | ICD-10-CM

## 2024-01-06 DIAGNOSIS — R1084 Generalized abdominal pain: Secondary | ICD-10-CM

## 2024-01-06 MED ORDER — GADOBUTROL 1 MMOL/ML IV SOLN
8.0000 mL | Freq: Once | INTRAVENOUS | Status: AC | PRN
  Administered 2024-01-06: 8 mL via INTRAVENOUS

## 2024-01-07 ENCOUNTER — Ambulatory Visit: Payer: Self-pay | Admitting: Gastroenterology

## 2024-01-29 ENCOUNTER — Other Ambulatory Visit: Payer: Self-pay | Admitting: Family Medicine

## 2024-01-29 DIAGNOSIS — H6992 Unspecified Eustachian tube disorder, left ear: Secondary | ICD-10-CM

## 2024-02-12 ENCOUNTER — Other Ambulatory Visit: Payer: Self-pay | Admitting: Family Medicine

## 2024-02-12 ENCOUNTER — Ambulatory Visit: Admitting: Gastroenterology

## 2024-02-12 DIAGNOSIS — L659 Nonscarring hair loss, unspecified: Secondary | ICD-10-CM

## 2024-02-19 ENCOUNTER — Ambulatory Visit: Admitting: Family Medicine

## 2024-02-19 ENCOUNTER — Ambulatory Visit: Payer: Self-pay

## 2024-02-19 VITALS — BP 116/78 | HR 79 | Temp 98.2°F | Ht 61.0 in | Wt 155.0 lb

## 2024-02-19 DIAGNOSIS — J069 Acute upper respiratory infection, unspecified: Secondary | ICD-10-CM | POA: Diagnosis not present

## 2024-02-19 DIAGNOSIS — R051 Acute cough: Secondary | ICD-10-CM

## 2024-02-19 MED ORDER — PREDNISONE 10 MG PO TABS: 10.0000 mg | ORAL_TABLET | Freq: Two times a day (BID) | ORAL | 0 refills | Status: DC

## 2024-02-19 MED ORDER — PROMETHAZINE-DM 6.25-15 MG/5ML PO SYRP: 5.0000 mL | ORAL_SOLUTION | Freq: Every evening | ORAL | 0 refills | Status: AC | PRN

## 2024-02-19 NOTE — Telephone Encounter (Signed)
 FYI Only or Action Required?: FYI only for provider: appointment scheduled on 11/5.  Patient was last seen in primary care on 01/01/2024 by [name], FNP.  Called Nurse Triage reporting Cough.  Symptoms began several days ago.  Interventions attempted: OTC medications: Mucinex.  Symptoms are: gradually worsening.  Triage Disposition: See Physician Within 24 Hours  Patient/caregiver understands and will follow disposition?: Yes Copied from CRM #[id number]. Topic: Clinical - Red Word Triage >> Feb 19, 2024  9:01 AM Roselie BROCKS wrote: [facility] that prompted transfer to Nurse Triage: Patient states she has pretty bad pain in her ear, chest pain and congestion,cough >> Feb 19, 2024  9:11 AM Roselie BROCKS wrote: Patient disconnected ,did not answer on call back. Reason for Disposition  [1] Continuous (nonstop) coughing interferes with work or school AND [2] no improvement using cough treatment per Care Advice    Has been taking Mucinex  Answer Assessment - Initial Assessment Questions 1. ONSET: When did the cough begin?      Started 3-4 days now  2. SEVERITY: How bad is the cough today?      Getting worse, feels like chest is heavy and congested  3. SPUTUM: Describe the color of your sputum (e.g., none, dry cough; clear, white, yellow, green)     Dry cough  4. HEMOPTYSIS: Are you coughing up any blood? If Yes, ask: How much? (e.g., flecks, streaks, tablespoons, etc.)     No  5. DIFFICULTY BREATHING: Are you having difficulty breathing? If Yes, ask: How bad is it? (e.g., mild, moderate, severe)      No  6. FEVER: Do you have a fever? If Yes, ask: What is your temperature, how was it measured, and when did it start?     Felt feverish last night, didn't get an actual but was warm  7. CARDIAC HISTORY: Do you have any history of heart disease? (e.g., heart attack, congestive heart failure)      Atrial Fribillation  8. LUNG HISTORY: Do you have any history of lung  disease?  (e.g., pulmonary embolus, asthma, emphysema)     No  9. PE RISK FACTORS: Do you have a history of blood clots? (or: recent major surgery, recent prolonged travel, bedridden)     No  10. OTHER SYMPTOMS: Do you have any other symptoms? (e.g., runny nose, wheezing, chest pain)       Body aches, back pain, ear pain (right), sore throat  11. PREGNANCY: Is there any chance you are pregnant? When was your last menstrual period?       No  12. TRAVEL: Have you traveled out of the country in the last month? (e.g., travel history, exposures)       No  Protocols used: Cough - Acute Productive-A-AH, Cough - Acute Non-Productive-A-AH

## 2024-02-19 NOTE — Progress Notes (Signed)
 Acute Office Visit  Subjective:     Patient ID: Sarah Meadows, female    DOB: 1965/06/09, 58 y.o.   MRN: [id number]  Chief Complaint  Patient presents with   Cough    X 3 days   Nasal Congestion    HPI Patient is in today for nasal congestion and cough. Present for 3 days. Recent trip and got sick on the way home. Throat scratchy. Maybe low grade fever. Tried mucinex which did not help. Flonase  (has at home)  Needs something for cough.  Right ear pain.     ROS      Objective:    BP 116/78 (Patient Position: Sitting, Cuff Size: Normal)   Pulse 79   Temp 98.2 F (36.8 C) (Oral)   Ht 5' 1 (1.549 m)   Wt 155 lb (70.3 kg)   LMP  (LMP Unknown) Comment: IUD, no possibility of pregnancy per patient  SpO2 100%   BMI 29.29 kg/m    Physical Exam Vitals and nursing note reviewed.  Constitutional:      General: She is not in acute distress.    Appearance: Normal appearance. She is ill-appearing.  HENT:     Right Ear: Tympanic membrane normal.     Left Ear: Tympanic membrane normal.     Nose:     Right Sinus: No maxillary sinus tenderness or frontal sinus tenderness.     Left Sinus: No maxillary sinus tenderness or frontal sinus tenderness.     Mouth/Throat:     Pharynx: Uvula midline. No pharyngeal swelling, oropharyngeal exudate or posterior oropharyngeal erythema.  Cardiovascular:     Rate and Rhythm: Normal rate and regular rhythm.     Heart sounds: Normal heart sounds.  Pulmonary:     Effort: Pulmonary effort is normal.     Breath sounds: Normal breath sounds.  Skin:    General: Skin is warm and dry.  Neurological:     General: No focal deficit present.     Mental Status: She is alert. Mental status is at baseline.  Psychiatric:        Mood and Affect: Mood normal.        Behavior: Behavior normal.        Thought Content: Thought content normal.        Judgment: Judgment normal.     No results found for any visits on 02/19/24.      Assessment  & Plan:   Problem List Items Addressed This Visit     Viral upper respiratory tract infection - Primary   Nasal congestion with cough for 3 days. Subjective low grade fever last night. Ear pain with normal TMs. Discussed viral illnesses and symptomatic treatment. Has Flonase  at home. Prednisone 10 mg BID for 5 days for inflammation and cough. Understands to monitor blood pressure while taking steroids.  Promethazine-DM at night for cough as instructed. OTC cough suppressant during the day.  Rest and supportive therapy. Follow-up if symptoms do not improve. Work note provided.       Relevant Medications   predniSONE (DELTASONE) 10 MG tablet   Other Visit Diagnoses       Acute cough       Relevant Medications   predniSONE (DELTASONE) 10 MG tablet   promethazine-dextromethorphan (PROMETHAZINE-DM) 6.25-15 MG/5ML syrup       Meds ordered this encounter  Medications   predniSONE (DELTASONE) 10 MG tablet    Sig: Take 1 tablet (10 mg total) by mouth 2 (two) times  daily with a meal.    Dispense:  10 tablet    Refill:  0    Supervising Provider:   METHENEY, CATHERINE D [[id number]]   promethazine-dextromethorphan (PROMETHAZINE-DM) 6.25-15 MG/5ML syrup    Sig: Take 5 mLs by mouth at bedtime as needed for cough.    Dispense:  118 mL    Refill:  0    Supervising Provider:   METHENEY, CATHERINE D [[id number]]  Agrees with plan of care discussed.  Questions answered.   Return if symptoms worsen or fail to improve.  Darice JONELLE Brownie, FNP

## 2024-02-19 NOTE — Assessment & Plan Note (Addendum)
 Nasal congestion with cough for 3 days. Subjective low grade fever last night. Ear pain with normal TMs. Discussed viral illnesses and symptomatic treatment. Has Flonase  at home. Prednisone 10 mg BID for 5 days for inflammation and cough. Understands to monitor blood pressure while taking steroids.  Promethazine-DM at night for cough as instructed. OTC cough suppressant during the day.  Rest and supportive therapy. Follow-up if symptoms do not improve. Work note provided.

## 2024-03-02 ENCOUNTER — Encounter: Payer: Self-pay | Admitting: Family Medicine

## 2024-03-02 ENCOUNTER — Telehealth: Admitting: Family Medicine

## 2024-03-02 DIAGNOSIS — E119 Type 2 diabetes mellitus without complications: Secondary | ICD-10-CM | POA: Diagnosis not present

## 2024-03-02 MED ORDER — TIRZEPATIDE 7.5 MG/0.5ML ~~LOC~~ SOAJ
7.5000 mg | SUBCUTANEOUS | 0 refills | Status: AC
Start: 2024-03-02 — End: ?

## 2024-03-02 NOTE — Progress Notes (Signed)
 Established Patient Office Visit  Subjective   Patient ID: Sarah Meadows, female    DOB: 07/11/1965  Age: 58 y.o. MRN: [id number]  Chief Complaint  Patient presents with   Follow-up    DM medication - no concerns  I connected with  Sarah Meadows on 03/02/24 by a video enabled telemedicine application and verified that I am speaking with the correct person using two identifiers.   I discussed the limitations of evaluation and management by telemedicine. The patient expressed understanding and agreed to proceed. Virtual Visit via Video Note  I connected with Sarah Meadows on 03/02/24 at  2:30 PM EST by a video enabled telemedicine application and verified that I am speaking with the correct person using two identifiers.  Location: Patient: home  Provider: office    I discussed the limitations of evaluation and management by telemedicine and the availability of in person appointments. The patient expressed understanding and agreed to proceed.  History of Present Illness:    Observations/Objective:   Assessment and Plan:   Follow Up Instructions:    I discussed the assessment and treatment plan with the patient. The patient was provided an opportunity to ask questions and all were answered. The patient agreed with the plan and demonstrated an understanding of the instructions.   The patient was advised to call back or seek an in-person evaluation if the symptoms worsen or if the condition fails to improve as anticipated.     Darice JONELLE Brownie, FNP   Taking Mounjaro  7.5 mg weekly. Tolerating well. No side effects reported.  Wants to stay on current dose for now. Refill sent. 01/01/24: A1C 5.5 Due for recheck in Dec/January. A1C has been stable.  Follow-up in 4-6 weeks for medication management.       ROS    Objective:     LMP  (LMP Unknown) Comment: IUD, no possibility of pregnancy per patient   Physical Exam Vitals and nursing note reviewed.   Constitutional:      General: She is not in acute distress.    Appearance: Normal appearance.  Pulmonary:     Effort: Pulmonary effort is normal.  Skin:    General: Skin is warm and dry.  Neurological:     General: No focal deficit present.     Mental Status: She is alert. Mental status is at baseline.  Psychiatric:        Mood and Affect: Mood normal.        Behavior: Behavior normal.        Thought Content: Thought content normal.        Judgment: Judgment normal.      No results found for any visits on 03/02/24.  Last metabolic panel Lab Results  Component Value Date   GLUCOSE 77 12/24/2023   NA 138 12/24/2023   K 3.9 12/24/2023   CL 101 12/24/2023   CO2 30 12/24/2023   BUN 12 12/24/2023   CREATININE 0.69 12/24/2023   GFR 96.47 12/24/2023   CALCIUM  9.9 12/24/2023   PROT 8.4 (H) 12/24/2023   ALBUMIN  4.1 12/24/2023   LABGLOB 3.5 10/01/2023   AGRATIO 1.2 09/01/2020   BILITOT 0.5 12/24/2023   ALKPHOS 74 12/24/2023   AST 19 12/24/2023   ALT 18 12/24/2023   ANIONGAP 6 03/21/2023   Last hemoglobin A1c Lab Results  Component Value Date   HGBA1C 5.5 01/01/2024      The 10-year ASCVD risk score (Arnett DK, et al., 2019) is: 7.5%  Assessment & Plan:   Problem List Items Addressed This Visit     Type 2 diabetes mellitus (HCC) - Primary   Taking Mounjaro  7.5 mg weekly. Tolerating well. No side effects reported.  Wants to stay on current dose for now. Refill sent. 01/01/24: A1C 5.5 Follow-up in 4 weeks for med management.       Relevant Medications   tirzepatide  (MOUNJARO ) 7.5 MG/0.5ML Pen  Agrees with plan of care discussed.  Questions answered.   Return in about 4 weeks (around 03/30/2024) for DM med management .    Darice JONELLE Brownie, FNP

## 2024-03-02 NOTE — Assessment & Plan Note (Addendum)
 Taking Mounjaro  7.5 mg weekly. Tolerating well. No side effects reported.  Wants to stay on current dose for now. Refill sent. 01/01/24: A1C 5.5 Follow-up in 4 weeks for med management.

## 2024-03-29 NOTE — Progress Notes (Unsigned)
 Sarah Console, PA-C [address] [place], [place]  [address] Phone: [phone]   Primary Care Physician: [name], FNP  Primary Gastroenterologist:  Sarah Console, PA-C / Elspeth Naval, MD   Chief Complaint: Follow-up Crohn's disease, chronic diarrhea and abdominal pain   HPI:   Discussed the use of AI scribe software for clinical note transcription with the patient, who gave verbal consent to proceed.   58 year old female with a past medical history of ileocolonic Crohn's disease initially diagnosed in 2006.  Previously on Remicade which was discontinued in 2021 when infliximab levels were undetectable with high antibody level.  She was subsequently transition to Humira which was discontinued after she developed a rash and was initially started on Stelara  in 2021 which she took for 4 to 6 months then self discontinued then Skyrizi  was restarted 12/2021.  Colonoscopy 10/2022 showed no active inflammation with concerns for fibrotic ileal stricturing disease and she subsequently underwent robotic ileocolectomy 03/2023.   Patient states she continues to have chronic intermittent lower abdominal cramping and daily diarrhea.  Lower abdominal cramping is relieved after bowel movement.  She denies melena, hematochezia, or weight loss.  She has abdominal bloating.  Her GI symptoms have been chronic for over 6 months.  GI symptoms are the same as her last office visit 3 months ago.  She has diarrhea after eating or drinking anything.  She avoids eating when she is at work because of the diarrhea.  She works at [employer] and [employer].  Remote history of cholecystectomy 2006.  Currently on Skyrizi  360 mg every 8 weeks.  Takes 4 Imodium  per day.  She tried cholestyramine  powder 1 packet daily which caused constipation and she discontinued.  Was prescribed Elavil  25 mg nightly 05/2023.  Patient states she has tried dicyclomine , IBgard, and Benefiber in the past with no benefit.  Diarrhea  worsened after having ileocolectomy 03/2023.  12/2023 normal CBC, CMP, and CRP.  Negative C. difficile PCR stool test.  Elevated fecal calprotectin 262.   Colonoscopy 05/22/2023: - The examined portion of the ileum was normal.  - Patent end-to-end ileo-colonic anastomosis, characterized by healthy appearing mucosa. Rutgeert's score i1. - Diverticulosis in the transverse colon and in the left colon.  - Internal hemorrhoids.  - The examination was otherwise normal.  - Biopsies were taken with a cold forceps from the right colon, left colon and transverse colon for evaluation of microscopic colitis. Overall, NO active inflammation. Suspect post operative functional change causing her symptoms. 1. Surgical [P], colon nos, random sites :       COLONIC MUCOSA WITH NO SIGNIFICANT PATHOLOGIC CHANGES.       NO MICROSCOPIC COLITIS, ACTIVE INFLAMMATION OR GRANULOMAS.   12/2023 Abd / pelvic MRI Enterography: 1. No evidence of active inflammatory bowel disease. 2. No new or acute inflammatory process identified within the abdomen or pelvis. 3. Multiple other nonacute observations, as described above.  Current Outpatient Medications  Medication Sig Dispense Refill   amitriptyline  (ELAVIL ) 25 MG tablet Take 1 tablet (25 mg total) by mouth at bedtime. 90 tablet 1   augmented betamethasone dipropionate (DIPROLENE-AF) 0.05 % ointment Apply 1 Application topically daily as needed (for irritation- affected areas).     cetirizine  (ZYRTEC ) 10 MG tablet Take 1 tablet (10 mg total) by mouth daily. 30 tablet 11   Cholecalciferol (VITAMIN D3) 1000 units CAPS Take 1,000 Units by mouth daily.     ezetimibe  (ZETIA ) 10 MG tablet Take 1 tablet (  10 mg total) by mouth daily. 90 tablet 0   flecainide (TAMBOCOR) 50 MG tablet Take 50 mg by mouth 2 (two) times daily.     fluocinonide  (LIDEX ) 0.05 % external solution APPLY TOPICALLY TWICE A DAY 60 mL 1   fluticasone  (FLONASE ) 50 MCG/ACT nasal spray Place 2 sprays into both  nostrils daily. 16 g 0   glycopyrrolate  (ROBINUL ) 2 MG tablet Take 1 tablet (2 mg total) by mouth 2 (two) times daily. 180 tablet 5   hydroquinone 4 % cream Apply 1 Application topically See admin instructions. Apply to affected areas 2 times a day     loperamide  (IMODIUM ) 2 MG capsule Take 2 capsules by mouth, up to three times daily as needed     Na Sulfate-K Sulfate-Mg Sulfate concentrate (SUPREP) 17.5-3.13-1.6 GM/177ML SOLN Take 1 kit (354 mLs total) by mouth once for 1 dose. 354 mL 0   nystatin cream (MYCOSTATIN) Apply 1 Application topically as needed.     promethazine -dextromethorphan (PROMETHAZINE -DM) 6.25-15 MG/5ML syrup Take 5 mLs by mouth at bedtime as needed for cough. 118 mL 0   Risankizumab -rzaa (SKYRIZI ) 360 MG/2.4ML SOCT Inject 360 mg into the skin every 8 (eight) weeks. 2.4 mL 6   tirzepatide  (MOUNJARO ) 7.5 MG/0.5ML Pen Inject 7.5 mg into the skin once a week. 6 mL 0   triamcinolone ointment (KENALOG) 0.1 % Apply 1 Application topically 2 (two) times daily as needed (for itching).     Current Facility-Administered Medications  Medication Dose Route Frequency Provider Last Rate Last Admin   dextrose  5 % solution   Intravenous Continuous Armbruster, Elspeth SQUIBB, MD        Allergies as of 03/30/2024 - Review Complete 03/30/2024  Allergen Reaction Noted   Adalimumab Rash and Other (See Comments) 09/02/2019   Nsaids Other (See Comments) 11/28/2022    Past Medical History:  Diagnosis Date   Allergy 06/19/19   Humiarua   Anemia    Arthritis    Crohn's disease (HCC) 2006   Crohn's disease of both small and large intestine (HCC)    Diabetes (HCC)    Type 2   GERD (gastroesophageal reflux disease) 2021   Headache    Hyperlipidemia 01/2020   Lichen planus    Restless leg syndrome    Sacroiliac inflammation     Past Surgical History:  Procedure Laterality Date   CESAREAN SECTION     x 2   CHOLECYSTECTOMY     COLONOSCOPY  04/2018    Review of Systems:    All systems  reviewed and negative except where noted in HPI.    Physical Exam:  BP (!) 140/82 (BP Location: Left Arm, Patient Position: Sitting, Cuff Size: Normal)   Pulse 76   Ht 5' 0.25 (1.53 m) Comment: height measured without shoes  Wt 156 lb 8 oz (71 kg)   LMP  (LMP Unknown) Comment: IUD, no possibility of pregnancy per patient  BMI 30.31 kg/m  No LMP recorded (lmp unknown). Patient is postmenopausal.  General: Well-nourished, well-developed in no acute distress.  Lungs: Clear to auscultation bilaterally. Non-labored. Heart: Regular rate and rhythm, no murmurs rubs or gallops.  Abdomen: Bowel sounds are normal; Abdomen is Soft; No hepatosplenomegaly, masses or hernias; there is mild to moderate generalized diffuse abdominal tenderness, worse on her left mid to lower abdomen.  Minimal tenderness on the right.  No guarding or rebound tenderness. Neuro: Alert and oriented x 3.  Grossly intact.  Psych: Alert and cooperative, normal mood and affect.  Imaging Studies: No results found.  Labs: CBC    Component Value Date/Time   WBC 5.9 12/24/2023 1633   RBC 4.44 12/24/2023 1633   HGB 12.9 12/24/2023 1633   HCT 38.6 12/24/2023 1633   PLT 300.0 12/24/2023 1633   MCV 86.8 12/24/2023 1633   MCH 30.6 03/21/2023 0511   MCHC 33.4 12/24/2023 1633   RDW 15.2 12/24/2023 1633   LYMPHSABS 2.6 12/24/2023 1633   MONOABS 0.4 12/24/2023 1633   EOSABS 0.2 12/24/2023 1633   BASOSABS 0.1 12/24/2023 1633    CMP     Component Value Date/Time   NA 138 12/24/2023 1633   NA 141 10/01/2023 0855   K 3.9 12/24/2023 1633   CL 101 12/24/2023 1633   CO2 30 12/24/2023 1633   GLUCOSE 77 12/24/2023 1633   BUN 12 12/24/2023 1633   BUN 10 10/01/2023 0855   CREATININE 0.69 12/24/2023 1633   CALCIUM  9.9 12/24/2023 1633   PROT 8.4 (H) 12/24/2023 1633   PROT 7.5 10/01/2023 0855   ALBUMIN  4.1 12/24/2023 1633   ALBUMIN  4.0 10/01/2023 0855   AST 19 12/24/2023 1633   ALT 18 12/24/2023 1633   ALKPHOS 74  12/24/2023 1633   BILITOT 0.5 12/24/2023 1633   BILITOT 0.3 10/01/2023 0855   GFRNONAA >60 03/21/2023 0511   GFRAA >60 05/06/2019 1556     Assessment and Plan:   Prima Rayner is a 58 y.o. 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 On Skyrizi .  Colonoscopy 05/2023 without evidence of active colitis. Previously took Dicyclomine  and IBgard without improvement. Currently taking Imodium  4 tabs daily without improvement. Cholestyramine  resulted in constipation, no BM x 3 days.  Abdominal exam today showed a soft nondistended abdomen with very mild periumbilical tenderness without rebound or guarding.  C. difficile PCR stool test negative.  Elevated fecal calprotectin 262.  1.  Crohn's disease with persistent diarrhea and abdominal pain - Scheduling Colonoscopy I discussed risks of colonoscopy with patient to include risk of bleeding, colon perforation, and risk of sedation.  Patient expressed understanding and agrees to proceed with colonoscopy.  - Restage disease in order to decide if biologic treatment should be changed. - Continue Skyrizi  360 mg every 8 weeks for now.  2.  Chronic Diarrhea: Ddx: Bile salt diarrhea CCY, IBS-D, Crohns, adverse effect of partial ileocolectomy. - Start FiberCon, Take 2 tablets Twice daily - Hold FiberCon 1 week before colonoscopy to ensure good prep - We discuss restarting lower dose cholestyramine  in the future (1 - 2 TSP / TBSP daily) - Continue Imodium  as needed.  Hold Imodium  1 week prior to colonoscopy.  3.  Low abdominal cramping - Rx Glycopyrolate 2 mg, take 1 tablet twice daily, #60, 3 refills.   Sarah Console, PA-C  Follow up F/U with Dr. Leigh after Colonoscopy.

## 2024-03-30 ENCOUNTER — Ambulatory Visit: Admitting: Physician Assistant

## 2024-03-30 ENCOUNTER — Encounter: Payer: Self-pay | Admitting: Physician Assistant

## 2024-03-30 VITALS — BP 140/82 | HR 76 | Ht 60.25 in | Wt 156.5 lb

## 2024-03-30 DIAGNOSIS — R197 Diarrhea, unspecified: Secondary | ICD-10-CM

## 2024-03-30 DIAGNOSIS — R1030 Lower abdominal pain, unspecified: Secondary | ICD-10-CM

## 2024-03-30 DIAGNOSIS — K50818 Crohn's disease of both small and large intestine with other complication: Secondary | ICD-10-CM

## 2024-03-30 MED ORDER — GLYCOPYRROLATE 2 MG PO TABS: 2.0000 mg | ORAL_TABLET | Freq: Two times a day (BID) | ORAL | 5 refills | Status: AC

## 2024-03-30 MED ORDER — NA SULFATE-K SULFATE-MG SULF 17.5-3.13-1.6 GM/177ML PO SOLN: 1.0000 | Freq: Once | ORAL | 0 refills | Status: AC

## 2024-03-30 NOTE — Patient Instructions (Addendum)
° °  To Help Diarrhea, Start Over the Counter FiberCon Tablets: Take 2 Caplets with a full glass of water or other liquid (8 ounces/240 milliliters)  Once Daily.  Increase to 2 Caplets Twice daily if needed.  **Note: Stop FiberCon 1 week before Colonoscopy to ensure adequate Prep.  To Help lower abdominal Cramping: Take Prescription Glycopyrolate 2mg  1 tablet twice daily. ___________________________________________________________________________  Sarah Meadows have been scheduled for a Colonoscopy. Please follow written instructions given to you at your visit today.   If you use inhalers (even only as needed), please bring them with you on the day of your procedure.  DO NOT TAKE 7 DAYS PRIOR TO TEST- Trulicity (dulaglutide) Ozempic, Wegovy (semaglutide) Mounjaro  (tirzepatide ) Bydureon Bcise (exanatide extended release)  DO NOT TAKE 1 DAY PRIOR TO YOUR TEST Rybelsus (semaglutide) Adlyxin (lixisenatide) Victoza (liraglutide) Byetta (exanatide) ___________________________________________________________________________  Please follow up sooner if symptoms increase or worsen   Due to recent changes in healthcare laws, you may see the results of your imaging and laboratory studies on MyChart before your provider has had a chance to review them.  We understand that in some cases there may be results that are confusing or concerning to you. Not all laboratory results come back in the same time frame and the provider may be waiting for multiple results in order to interpret others.  Please give us  48 hours in order for your provider to thoroughly review all the results before contacting the office for clarification of your results.   Thank you for trusting me with your gastrointestinal care!   Ellouise Console, PA-C _______________________________________________________  If your blood pressure at your visit was 140/90 or greater, please contact your primary care physician to follow up on  this.  _______________________________________________________  If you are age 1 or older, your body mass index should be between 23-30. Your Body mass index is 30.31 kg/m. If this is out of the aforementioned range listed, please consider follow up with your Primary Care Provider.  If you are age 26 or younger, your body mass index should be between 19-25. Your Body mass index is 30.31 kg/m. If this is out of the aformentioned range listed, please consider follow up with your Primary Care Provider.   ________________________________________________________  The [facility] providers would like to encourage you to use MYCHART to communicate with providers for non-urgent requests or questions.  Due to long hold times on the telephone, sending your provider a message by [facility] may be a faster and more efficient way to get a response.  Please allow 48 business hours for a response.  Please remember that this is for non-urgent requests.  _______________________________________________________

## 2024-03-31 NOTE — Progress Notes (Signed)
 Agree with assessment and plan as outlined. I agree with colonoscopy, will be helpful to determine what is driving her symptoms at this point. I do suspect a functional component but if she has active IBD on her exam will need to reassess her regimen. Thanks

## 2024-04-09 ENCOUNTER — Other Ambulatory Visit: Payer: Self-pay | Admitting: Family Medicine

## 2024-04-09 DIAGNOSIS — L659 Nonscarring hair loss, unspecified: Secondary | ICD-10-CM

## 2024-04-22 ENCOUNTER — Encounter: Payer: Self-pay | Admitting: Gastroenterology

## 2024-04-29 ENCOUNTER — Encounter: Admitting: Gastroenterology

## 2024-05-04 ENCOUNTER — Encounter: Admitting: Gastroenterology

## 2024-05-14 ENCOUNTER — Encounter: Payer: Self-pay | Admitting: Nurse Practitioner

## 2024-05-14 ENCOUNTER — Ambulatory Visit: Admitting: Nurse Practitioner

## 2024-05-14 ENCOUNTER — Ambulatory Visit: Payer: Self-pay

## 2024-05-14 VITALS — BP 110/82 | HR 87 | Temp 96.7°F | Ht 61.0 in | Wt 153.2 lb

## 2024-05-14 DIAGNOSIS — M5489 Other dorsalgia: Secondary | ICD-10-CM

## 2024-05-14 DIAGNOSIS — M549 Dorsalgia, unspecified: Secondary | ICD-10-CM | POA: Insufficient documentation

## 2024-05-14 DIAGNOSIS — W19XXXD Unspecified fall, subsequent encounter: Secondary | ICD-10-CM

## 2024-05-14 MED ORDER — HYDROCODONE-ACETAMINOPHEN 5-325 MG PO TABS: 1.0000 | ORAL_TABLET | Freq: Three times a day (TID) | ORAL | 0 refills | Status: AC | PRN

## 2024-05-14 NOTE — Patient Instructions (Signed)
 It was great to see you!  Start hydrocodone  1-2 tablets every 8 hours as needed for severe pain   You can keep using ice or heat as needed   Start robaxin  every 6 hours as needed for muscle tightness or spasms   Let's follow-up if your symptoms worsen or any concerns   Take care,  Tinnie Harada, NP

## 2024-05-14 NOTE — Progress Notes (Signed)
 "  Acute Visit  BP 110/82 (BP Location: Right Arm, Patient Position: Sitting, Cuff Size: Normal)   Pulse 87   Temp (!) 96.7 F (35.9 C)   Ht 5' 1 (1.549 m)   Wt 153 lb 3.2 oz (69.5 kg)   LMP  (LMP Unknown) Comment: IUD, no possibility of pregnancy per patient  SpO2 97%   BMI 28.95 kg/m    Subjective:    Patient ID: Sarah Meadows, female    DOB: 08/08/1965, 59 y.o.   MRN: [id number]  CC: Chief Complaint  Patient presents with   Fall    Hitting head,shoulders, back and left arm, left hip, and left leg pain x-rays at [facility]    HPI: Sarah Meadows is a 59 y.o. female presents for a fall she took several days ago.  Discussed the use of AI scribe software for clinical note transcription with the patient, who gave verbal consent to proceed.  She slipped on ice on Tuesday, landing on her head and back. Pain developed later that day. She could not reach the ER the next morning due to ice but was seen at urgent care, where neck, back, and left shoulder x-rays were done. She received an injection of toradol for pain without relief.  She now has persistent pain in the left arm, neck, back of the head, and back, with soreness in hips and knees. Left arm pain worsens with movement such as putting on a shirt. Neck and back are tender to touch. She has shooting pains in her toes, tingling in the left arm, and soreness in a right finger joint. She notes a sharp pain on the right side of her chest when left arm pain flares and causes her to cough. She reports no bruising but soreness at the back of the neck and had nausea on the first day after the fall. She uses heat with some relief, has not tried ice, and took one 800 mg Motrin without benefit. She has not used other OTC medications.  She was prescribed robaxin  and prednisone  at urgent care and has not picked these up yet. She denies nausea, confusion, incontinence.      Past Medical History:  Diagnosis Date   Allergy 06/19/19   [place]    Anemia    Arthritis    Crohn's disease (HCC) 2006   Crohn's disease of both small and large intestine (HCC)    Diabetes (HCC)    Type 2   GERD (gastroesophageal reflux disease) 2021   Headache    Hyperlipidemia 01/2020   Lichen planus    Restless leg syndrome    Sacroiliac inflammation     Past Surgical History:  Procedure Laterality Date   CESAREAN SECTION     x 2   CHOLECYSTECTOMY     COLONOSCOPY  04/2018    Family History  Problem Relation Age of Onset   Diabetes Mother    Arthritis Mother    Blindness Father    Dementia Father    Diabetes Father    Vision loss Father    Crohn's disease Son    Obesity Sister    ADD / ADHD Son    Stroke Brother    Colon cancer Neg Hx    Pancreatic cancer Neg Hx    Stomach cancer Neg Hx    Colon polyps Neg Hx    Esophageal cancer Neg Hx    Rectal cancer Neg Hx      Social History[1]  Medications Ordered Prior to  Encounter[2]   Review of Systems See pertinent positives and negatives per HPI.     Objective:    BP 110/82 (BP Location: Right Arm, Patient Position: Sitting, Cuff Size: Normal)   Pulse 87   Temp (!) 96.7 F (35.9 C)   Ht 5' 1 (1.549 m)   Wt 153 lb 3.2 oz (69.5 kg)   LMP  (LMP Unknown) Comment: IUD, no possibility of pregnancy per patient  SpO2 97%   BMI 28.95 kg/m   Wt Readings from Last 3 Encounters:  05/14/24 153 lb 3.2 oz (69.5 kg)  03/30/24 156 lb 8 oz (71 kg)  02/19/24 155 lb (70.3 kg)    BP Readings from Last 3 Encounters:  05/14/24 110/82  03/30/24 (!) 140/82  02/19/24 116/78    Physical Exam Vitals and nursing note reviewed.  Constitutional:      General: She is not in acute distress.    Appearance: Normal appearance.  HENT:     Head: Normocephalic.  Eyes:     Conjunctiva/sclera: Conjunctivae normal.     Pupils: Pupils are equal, round, and reactive to light.  Cardiovascular:     Rate and Rhythm: Normal rate and regular rhythm.     Pulses: Normal pulses.     Heart sounds: Normal  heart sounds.  Pulmonary:     Effort: Pulmonary effort is normal.     Breath sounds: Normal breath sounds.  Musculoskeletal:        General: Tenderness (left paraspinal muscles, left upper arm/shoulder) present. No swelling.     Cervical back: Normal range of motion.     Comments: Lumbar and left shoulder ROM limited due to pain.   Skin:    General: Skin is warm.     Findings: No bruising.  Neurological:     General: No focal deficit present.     Mental Status: She is alert and oriented to person, place, and time.     Coordination: Coordination normal.     Gait: Gait normal.  Psychiatric:        Mood and Affect: Mood normal.        Behavior: Behavior normal.        Thought Content: Thought content normal.        Judgment: Judgment normal.        Assessment & Plan:   Other acute back pain after fall Severe musculoskeletal pain with muscle spasms occurred post-fall, though X-rays of cervical, thoracic, lumbar spine and left shoulder are normal. Pain management is challenging due to Crohn's and inability to take NSAIDs. Recommend Robaxin  every six hours as needed for muscle pain and stiffness. PDMP reviewed. Prescribe hydrocodone -acetaminophen  5/325 mg, 1-2 tablets three times a day as needed for severe pain. Advise against driving after taking hydrocodone -acetaminophen  due to potential drowsiness. Recommend heat or ice for muscle tightness and spasms. Advise against prolonged sitting or lying down and encourage gentle movement. Provide a work note excusing her from work until Tuesday. Follow-up with worsening symptoms or concerns.     Fall See plan above after slipping on ice.   Follow up plan: Return if symptoms worsen or fail to improve.  Tinnie DELENA Harada, NP  I,Emily Lagle,acting as a scribe for [employer], NP.,have documented all relevant documentation on the behalf of Jeshurun [place] DELENA Harada, NP.  I, Tinnie DELENA Harada, NP, have reviewed all documentation for this visit. The  documentation on 05/14/2024 for the exam, diagnosis, procedures, and orders are all accurate and complete.     [  1]  Social History Tobacco Use   Smoking status: Never    Passive exposure: Never   Smokeless tobacco: Never  Vaping Use   Vaping status: Never Used  Substance Use Topics   Alcohol use: Not Currently    Comment: Rare   Drug use: Never  [2]  Current Outpatient Medications on File Prior to Visit  Medication Sig Dispense Refill   amitriptyline  (ELAVIL ) 25 MG tablet Take 1 tablet (25 mg total) by mouth at bedtime. 90 tablet 1   augmented betamethasone dipropionate (DIPROLENE-AF) 0.05 % ointment Apply 1 Application topically daily as needed (for irritation- affected areas).     cetirizine  (ZYRTEC ) 10 MG tablet Take 1 tablet (10 mg total) by mouth daily. 30 tablet 11   Cholecalciferol (VITAMIN D3) 1000 units CAPS Take 1,000 Units by mouth daily.     ezetimibe  (ZETIA ) 10 MG tablet Take 1 tablet (10 mg total) by mouth daily. 90 tablet 0   flecainide (TAMBOCOR) 50 MG tablet Take 50 mg by mouth 2 (two) times daily.     fluocinonide  (LIDEX ) 0.05 % external solution APPLY TOPICALLY TWICE A DAY 60 mL 1   fluticasone  (FLONASE ) 50 MCG/ACT nasal spray Place 2 sprays into both nostrils daily. 16 g 0   glycopyrrolate  (ROBINUL ) 2 MG tablet Take 1 tablet (2 mg total) by mouth 2 (two) times daily. 180 tablet 5   hydroquinone 4 % cream Apply 1 Application topically See admin instructions. Apply to affected areas 2 times a day     loperamide  (IMODIUM ) 2 MG capsule Take 2 capsules by mouth, up to three times daily as needed     nystatin cream (MYCOSTATIN) Apply 1 Application topically as needed.     Risankizumab -rzaa (SKYRIZI ) 360 MG/2.4ML SOCT Inject 360 mg into the skin every 8 (eight) weeks. 2.4 mL 6   tirzepatide  (MOUNJARO ) 7.5 MG/0.5ML Pen Inject 7.5 mg into the skin once a week. 6 mL 0   triamcinolone ointment (KENALOG) 0.1 % Apply 1 Application topically 2 (two) times daily as needed (for  itching).     methocarbamol  (ROBAXIN ) 500 MG tablet Take 500 mg by mouth every 6 (six) hours as needed. (Patient not taking: Reported on 05/14/2024)     predniSONE  (DELTASONE ) 20 MG tablet Take 40 mg by mouth daily with breakfast. (Patient not taking: Reported on 05/14/2024)     promethazine -dextromethorphan (PROMETHAZINE -DM) 6.25-15 MG/5ML syrup Take 5 mLs by mouth at bedtime as needed for cough. (Patient not taking: Reported on 05/14/2024) 118 mL 0   Current Facility-Administered Medications on File Prior to Visit  Medication Dose Route Frequency Provider Last Rate Last Admin   dextrose  5 % solution   Intravenous Continuous Armbruster, Elspeth SQUIBB, MD       "

## 2024-05-14 NOTE — Telephone Encounter (Signed)
" ° °  FYI Only or Action Required?: FYI only for provider: appointment scheduled on 05/14/24 at [facility].  Patient was last seen in primary care on 03/02/2024 by [name], FNP.  Called Nurse Triage reporting Fall.  Symptoms began several days ago.  Interventions attempted: OTC medications: Ibuprofen.  Symptoms are: unchanged.  Triage Disposition: See HCP Within 4 Hours (Or PCP Triage)  Patient/caregiver understands and will follow disposition?: Yes  Reason for Disposition  [1] MODERATE weakness (e.g., interferes with work, school, normal activities) AND [2] new-onset or getting worse  Answer Assessment - Initial Assessment Questions Pt has taken Ibuprofen, no relief.   1. MECHANISM: How did the fall happen?     Slipped on ice  3. ONSET: When did the fall happen? (e.g., minutes, hours, or days ago)     2 days ago  4. LOCATION: What part of the body hit the ground? (e.g., back, buttocks, head, hips, knees, hands, head, stomach)     Head, back, shoulders, fell flat back  5. INJURY: Did you hurt (injure) yourself when you fell? If Yes, ask: What did you injure? Tell me more about this? (e.g., body area; type of injury; pain severity)     Head, neck pain, back pain, left arm, left leg, left hip  6. PAIN: Is there any pain? If Yes, ask: How bad is the pain? (e.g., Scale 0-10; or none, mild,      10/10  7. SIZE: For cuts, bruises, or swelling, ask: How large is it? (e.g., inches or centimeters)      Denies  9. OTHER SYMPTOMS: Do you have any other symptoms? (e.g., dizziness, fever, weakness; new-onset or worsening).      Mild nausea yesterday but has subsided. Headaches. Denies dizziness, vision changes.  Protocols used: Falls and [facility] from [place] [facility] sent at 05/14/2024 11:15 AM EST  Reason for Triage: Slip and fell, hitting head and back. She is having pain in head, back, neck, arms and legs. Would like an appt   "

## 2024-05-14 NOTE — Telephone Encounter (Signed)
 Pt scheduled

## 2024-05-15 ENCOUNTER — Emergency Department (HOSPITAL_BASED_OUTPATIENT_CLINIC_OR_DEPARTMENT_OTHER)
Admission: EM | Admit: 2024-05-15 | Discharge: 2024-05-15 | Disposition: A | Discharge status: Home / Self Care | Attending: Emergency Medicine | Admitting: Emergency Medicine

## 2024-05-15 ENCOUNTER — Emergency Department (HOSPITAL_BASED_OUTPATIENT_CLINIC_OR_DEPARTMENT_OTHER)

## 2024-05-15 ENCOUNTER — Other Ambulatory Visit: Payer: Self-pay

## 2024-05-15 ENCOUNTER — Encounter (HOSPITAL_BASED_OUTPATIENT_CLINIC_OR_DEPARTMENT_OTHER): Payer: Self-pay | Admitting: Emergency Medicine

## 2024-05-15 DIAGNOSIS — W01198A Fall on same level from slipping, tripping and stumbling with subsequent striking against other object, initial encounter: Secondary | ICD-10-CM | POA: Diagnosis not present

## 2024-05-15 DIAGNOSIS — S0990XA Unspecified injury of head, initial encounter: Secondary | ICD-10-CM | POA: Insufficient documentation

## 2024-05-15 DIAGNOSIS — W19XXXA Unspecified fall, initial encounter: Secondary | ICD-10-CM

## 2024-05-15 NOTE — ED Notes (Signed)
 Patient transported to CT

## 2024-05-15 NOTE — ED Provider Notes (Signed)
 " [place] [facility] Provider Note   CSN: [id number] Arrival date & time: 05/15/24  1558     Patient presents with: Sarah Meadows is a 59 y.o. female.   59 year old female presents today for a fall that occurred on Tuesday.  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  She states both of her legs were pulled out from underneath her and she fell backwards onto her back and head.  The history is provided by the patient. No language interpreter was used.       Prior to Admission medications  Medication Sig Start Date End Date Taking? Authorizing Provider  amitriptyline  (ELAVIL ) 25 MG tablet Take 1 tablet (25 mg total) by mouth at bedtime. 05/22/23   Leigh Elspeth SQUIBB, MD  augmented betamethasone dipropionate (DIPROLENE-AF) 0.05 % ointment Apply 1 Application topically daily as needed (for irritation- affected areas).    [provider]  cetirizine  (ZYRTEC ) 10 MG tablet Take 1 tablet (10 mg total) by mouth daily. 01/01/24   [name], FNP  Cholecalciferol (VITAMIN D3) 1000 units CAPS Take 1,000 Units by mouth daily.    [provider]  ezetimibe  (ZETIA ) 10 MG tablet Take 1 tablet (10 mg total) by mouth daily. 10/04/23   [name], FNP  flecainide (TAMBOCOR) 50 MG tablet Take 50 mg by mouth 2 (two) times daily. 02/19/24 02/18/25  [provider]  fluocinonide  (LIDEX ) 0.05 % external solution APPLY TOPICALLY TWICE A DAY 04/09/24   [name], FNP  fluticasone  (FLONASE ) 50 MCG/ACT nasal spray Place 2 sprays into both nostrils daily. 01/01/24   [name], FNP  glycopyrrolate  (ROBINUL ) 2 MG tablet Take 1 tablet (2 mg total) by mouth 2 (two) times daily. 03/30/24   [place], PA-C  HYDROcodone -acetaminophen  (NORCO/VICODIN) 5-325 MG tablet Take 1-2 tablets by mouth every 8 (eight) hours as needed for severe pain (pain score 7-10). 05/14/24   McElwee, Lauren A, NP  hydroquinone  4 % cream Apply 1 Application topically See admin instructions. Apply to affected areas 2 times a day 09/05/22   [provider]  loperamide  (IMODIUM ) 2 MG capsule Take 2 capsules by mouth, up to three times daily as needed 05/07/23   May, Deanna J, NP  methocarbamol  (ROBAXIN ) 500 MG tablet Take 500 mg by mouth every 6 (six) hours as needed. Patient not taking: Reported on 05/14/2024 05/13/24 05/23/24  [provider]  nystatin cream (MYCOSTATIN) Apply 1 Application topically as needed. 04/03/22   [provider]  predniSONE  (DELTASONE ) 20 MG tablet Take 40 mg by mouth daily with breakfast. Patient not taking: Reported on 05/14/2024 05/13/24 05/18/24  [provider]  promethazine -dextromethorphan (PROMETHAZINE -DM) 6.25-15 MG/5ML syrup Take 5 mLs by mouth at bedtime as needed for cough. Patient not taking: Reported on 05/14/2024 02/19/24   [name], FNP  Risankizumab -rzaa (SKYRIZI ) 360 MG/2.4ML SOCT Inject 360 mg into the skin every 8 (eight) weeks. 08/15/23   Armbruster, Elspeth SQUIBB, MD  tirzepatide  (MOUNJARO ) 7.5 MG/0.5ML Pen Inject 7.5 mg into the skin once a week. 03/02/24   [name], FNP  triamcinolone ointment (KENALOG) 0.1 % Apply 1 Application topically 2 (two) times daily as needed (for itching).    [provider]    Allergies: Adalimumab and Nsaids    Review of Systems  Constitutional:  Negative for chills and fever.  Gastrointestinal:  Negative for nausea and vomiting.  Musculoskeletal:  Positive for myalgias. Negative for arthralgias.  Neurological:  Positive for headaches. Negative for syncope.  All other systems reviewed and are negative.   Updated Vital Signs BP 133/79 (BP Location: Right Arm)   Pulse 79   Temp 98.3 F (36.8 C)   Resp (!) 22   Ht 5' 1 (1.549 m)   Wt 69.4 kg   LMP  (LMP Unknown) Comment: IUD, no possibility of pregnancy per patient  SpO2 100%   BMI 28.91 kg/m   Physical Exam Vitals and nursing note  reviewed.  Constitutional:      General: She is not in acute distress.    Appearance: Normal appearance. She is not ill-appearing.  HENT:     Head: Normocephalic and atraumatic.     Nose: Nose normal.  Eyes:     Conjunctiva/sclera: Conjunctivae normal.  Pulmonary:     Effort: Pulmonary effort is normal. No respiratory distress.  Musculoskeletal:        General: No deformity.  Skin:    Findings: No rash.  Neurological:     General: No focal deficit present.     Mental Status: She is alert and oriented to person, place, and time. Mental status is at baseline.     Cranial Nerves: No cranial nerve deficit.     Sensory: No sensory deficit.     Motor: No weakness.     Comments: Cranial nerves III through XII intact.  Good range of motion in bilateral upper and lower extremities with 5/5 strength.  No pronator drift.  Pupils equal round reactive to light, EOMs intact.     (all labs ordered are listed, but only abnormal results are displayed) Labs Reviewed - No data to display  EKG: None  Radiology: CT Cervical Spine Wo Contrast Result Date: 05/15/2024 EXAM: CT CERVICAL SPINE WITHOUT CONTRAST 05/15/2024 04:56:00 PM TECHNIQUE: CT of the cervical spine was performed without the administration of intravenous contrast. Multiplanar reformatted images are provided for review. Automated exposure control, iterative reconstruction, and/or weight based adjustment of the mA/kV was utilized to reduce the radiation dose to as low as reasonably achievable. COMPARISON: None available. CLINICAL HISTORY: Polytrauma, blunt. FINDINGS: BONES AND ALIGNMENT: Straightening of the normal cervical lordosis. No evidence of traumatic malalignment. No acute fracture. DEGENERATIVE CHANGES: Disc osteophyte complexes at multiple levels. Facet arthrosis and uncovertebral hypertrophy at multiple levels. No high grade osseous spinal canal stenosis. SOFT TISSUES: No prevertebral soft tissue swelling. IMPRESSION: 1. No  evidence of acute traumatic injury. Electronically signed by: Donnice Mania MD 05/15/2024 05:09 PM EST RP Workstation: [id number]   CT Head Wo Contrast Result Date: 05/15/2024 EXAM: CT HEAD WITHOUT CONTRAST 05/15/2024 04:55:00 PM TECHNIQUE: CT of the head was performed without the administration of intravenous contrast. Automated exposure control, iterative reconstruction, and/or weight based adjustment of the mA/kV was utilized to reduce the radiation dose to as low as reasonably achievable. COMPARISON: 02/03/2023 CLINICAL HISTORY: Polytrauma, blunt. FINDINGS: BRAIN AND VENTRICLES: No acute hemorrhage. No evidence of acute infarct. No hydrocephalus. No extra-axial collection. No mass effect or midline shift. ORBITS: No acute abnormality. SINUSES: Mild mucosal thickening in the left ethmoid sinus and right maxillary sinus. SOFT TISSUES AND SKULL: No acute soft tissue abnormality. No skull fracture. IMPRESSION: 1. No acute intracranial abnormality. Electronically signed by: Donnice Mania MD 05/15/2024 05:06 PM EST RP Workstation: [id number]     Procedures   Medications Ordered in the ED - No data to display  Clinical Course as of 05/15/24 1748  Fri  May 15, 2024  1642 Patient evaluated.  Appears well on exam.  Neurologic exam is reassuring. [AA]    Clinical Course User Index [AA] Hildegard Loge, PA-C                                 Medical Decision Making Amount and/or Complexity of Data Reviewed Radiology: ordered.   59 year old female presents today for concern of headache after head injury that occurred on Tuesday when she fell after slipping on ice.  Apparently she has been evaluated by her PCP and urgent care and has been prescribed narcotic pain medication, muscle relaxer, prednisone  for her symptoms already.  They wanted her evaluated for head injury so she was recommended to come here. CT head and neck showed no acute findings. She likely has concussion.  Supportive care discussed. She  will continue to take the medication she has been prescribed.  Patient discharged in stable condition.  She voices understanding and is in agreement with plan.   Final diagnoses:  Fall, initial encounter  Injury of head, initial encounter    ED Discharge Orders     None          Hildegard Loge, PA-C 05/15/24 1800    Franklyn Sid SAILOR, MD 05/15/24 1821  "

## 2024-05-15 NOTE — ED Triage Notes (Signed)
 Pt reports mechanical fall on ice Tuesday, reports she hit her head when she fell.  +blurred vision

## 2024-05-15 NOTE — ED Notes (Signed)
 Discharge instructions reviewed with patient. Patient verbalizes understanding, no further questions at this time. Medications and follow up information provided. No acute distress noted at time of departure.

## 2024-05-15 NOTE — Discharge Instructions (Signed)
 CT scan of the head and neck did not show any worrisome findings.  You likely have a concussion.  Symptoms typically last around 7 days.  If they last longer than that then you should be reevaluated by your primary care doctor or the sports medicine clinic I have listed above.  Return for any worrisome symptoms.  Take Tylenol  and ibuprofen for pain control.  Use the pain medication you were given for severe or breakthrough pain.

## 2024-06-01 ENCOUNTER — Ambulatory Visit: Admitting: Physician Assistant
